# Patient Record
Sex: Male | Born: 1960 | Race: White | Hispanic: No | Marital: Married | Smoking: Never smoker
Health system: Southern US, Community
[De-identification: ages and names within clinical notes are randomized; demographics above are authoritative.]

## PROBLEM LIST (undated history)

## (undated) DIAGNOSIS — M109 Gout, unspecified: Secondary | ICD-10-CM

## (undated) DIAGNOSIS — R569 Unspecified convulsions: Secondary | ICD-10-CM

## (undated) DIAGNOSIS — M1990 Unspecified osteoarthritis, unspecified site: Secondary | ICD-10-CM

## (undated) DIAGNOSIS — E785 Hyperlipidemia, unspecified: Secondary | ICD-10-CM

## (undated) DIAGNOSIS — T7840XA Allergy, unspecified, initial encounter: Secondary | ICD-10-CM

## (undated) DIAGNOSIS — F32A Depression, unspecified: Secondary | ICD-10-CM

## (undated) DIAGNOSIS — I10 Essential (primary) hypertension: Secondary | ICD-10-CM

## (undated) HISTORY — DX: Essential (primary) hypertension: I10

## (undated) HISTORY — DX: Gout, unspecified: M10.9

## (undated) HISTORY — DX: Depression, unspecified: F32.A

## (undated) HISTORY — DX: Allergy, unspecified, initial encounter: T78.40XA

## (undated) HISTORY — DX: Hyperlipidemia, unspecified: E78.5

## (undated) HISTORY — DX: Unspecified convulsions: R56.9

## (undated) HISTORY — DX: Unspecified osteoarthritis, unspecified site: M19.90

---

## 2007-11-05 ENCOUNTER — Encounter: Payer: Self-pay | Admitting: Family Medicine

## 2007-11-05 LAB — CONVERTED CEMR LAB
AST: 15 units/L
BUN: 7 mg/dL
Chloride: 97 meq/L
Cholesterol: 168 mg/dL
Creatinine, Ser: 0.9 mg/dL
Glucose, Bld: 83 mg/dL
HCT: 39.6 %
Hemoglobin: 13.5 g/dL
PSA: 2.2 ng/mL
Platelets: 208 10*3/uL
Potassium: 4.3 meq/L

## 2008-08-09 ENCOUNTER — Emergency Department (HOSPITAL_COMMUNITY): Admission: EM | Admit: 2008-08-09 | Discharge: 2008-08-09 | Payer: Self-pay | Admitting: Emergency Medicine

## 2008-11-21 ENCOUNTER — Ambulatory Visit: Payer: Self-pay | Admitting: Family Medicine

## 2008-11-21 DIAGNOSIS — M5030 Other cervical disc degeneration, unspecified cervical region: Secondary | ICD-10-CM | POA: Insufficient documentation

## 2008-11-21 DIAGNOSIS — M5137 Other intervertebral disc degeneration, lumbosacral region: Secondary | ICD-10-CM | POA: Insufficient documentation

## 2008-11-22 ENCOUNTER — Encounter: Payer: Self-pay | Admitting: Family Medicine

## 2008-11-22 DIAGNOSIS — F1021 Alcohol dependence, in remission: Secondary | ICD-10-CM | POA: Insufficient documentation

## 2008-11-22 DIAGNOSIS — E785 Hyperlipidemia, unspecified: Secondary | ICD-10-CM | POA: Insufficient documentation

## 2008-11-22 DIAGNOSIS — I10 Essential (primary) hypertension: Secondary | ICD-10-CM | POA: Insufficient documentation

## 2008-11-22 DIAGNOSIS — M899 Disorder of bone, unspecified: Secondary | ICD-10-CM | POA: Insufficient documentation

## 2008-11-22 DIAGNOSIS — M109 Gout, unspecified: Secondary | ICD-10-CM

## 2008-11-22 DIAGNOSIS — M949 Disorder of cartilage, unspecified: Secondary | ICD-10-CM

## 2008-11-22 DIAGNOSIS — F3289 Other specified depressive episodes: Secondary | ICD-10-CM | POA: Insufficient documentation

## 2008-11-22 DIAGNOSIS — F329 Major depressive disorder, single episode, unspecified: Secondary | ICD-10-CM | POA: Insufficient documentation

## 2008-11-22 HISTORY — DX: Essential (primary) hypertension: I10

## 2008-11-22 HISTORY — DX: Gout, unspecified: M10.9

## 2008-11-22 HISTORY — DX: Hyperlipidemia, unspecified: E78.5

## 2008-11-29 ENCOUNTER — Ambulatory Visit: Payer: Self-pay | Admitting: Family Medicine

## 2009-01-31 ENCOUNTER — Ambulatory Visit: Payer: Self-pay | Admitting: Family Medicine

## 2009-01-31 DIAGNOSIS — K5289 Other specified noninfective gastroenteritis and colitis: Secondary | ICD-10-CM | POA: Insufficient documentation

## 2009-12-18 ENCOUNTER — Ambulatory Visit: Payer: Self-pay | Admitting: Family Medicine

## 2009-12-18 DIAGNOSIS — J00 Acute nasopharyngitis [common cold]: Secondary | ICD-10-CM | POA: Insufficient documentation

## 2009-12-20 ENCOUNTER — Encounter: Payer: Self-pay | Admitting: Family Medicine

## 2009-12-20 ENCOUNTER — Telehealth: Payer: Self-pay | Admitting: Family Medicine

## 2010-01-29 ENCOUNTER — Telehealth: Payer: Self-pay | Admitting: Family Medicine

## 2010-01-30 ENCOUNTER — Ambulatory Visit: Payer: Self-pay | Admitting: Family Medicine

## 2010-01-30 DIAGNOSIS — K047 Periapical abscess without sinus: Secondary | ICD-10-CM | POA: Insufficient documentation

## 2010-04-02 NOTE — Progress Notes (Signed)
Summary: abcess tooth  Phone Note Call from Patient Call back at Home Phone [phone]   Caller: Patient Summary of Call: Pt calls and states has an abcessed tooth and dentist will not see him until the infections is gone. Wanted to know if you would call in antibiotic for him or does he need to be seen. Uses [facility] in [place]. Pt doesn't get off work until 4:00pm. Pt is allergic to PCN Initial call taken by: Kathlene November LPN,  January 29, 2010 8:28 AM  Follow-up for Phone Call        I'd like to confirm this with his dentist.   Follow-up by: Seymour Bars DO,  January 29, 2010 8:43 AM  Additional Follow-up for Phone Call Additional follow up Details #1::        Called and [facility] for pt to return call Additional Follow-up by: Kathlene November LPN,  January 29, 2010 9:04 AM    Additional Follow-up for Phone Call Additional follow up Details #2::    [phone] and they said he has to meet certain criteria before ever seen by a dentist there and that criteria is must be 100% service connected and he is not so he could never be seen by a dentist with them due to this Follow-up by: Kathlene November LPN,  January 29, 2010 4:20 PM  Additional Follow-up for Phone Call Additional follow up Details #3:: Details for Additional Follow-up Action Taken: see if he can come in for OV tomorrow.  This will be a temporary fix as he needs to see a dentist for treatment. Additional Follow-up by: Seymour Bars DO,  January 29, 2010 4:39 PM

## 2010-04-02 NOTE — Assessment & Plan Note (Signed)
Summary: dental abscess   Vital Signs:  Patient profile:   50 year old male Height:      75 inches Weight:      223 pounds BMI:     27.97 O2 Sat:      97 % on Room air Temp:     98.3 degrees F oral Pulse rate:   80 / minute BP sitting:   130 / 78  (left arm) Cuff size:   large  Vitals Entered By: Payton Spark CMA (January 30, 2010 3:51 PM)  O2 Flow:  Room air CC: Abscessed tooth   Primary Care Provider:  Seymour Bars DO  CC:  Abscessed tooth.  History of Present Illness: 50 yo WM presents for a R upper dental abscess.  This started to hurt 3 days ago.  Hurts with chewing.  No fevers or chills.  Has some R cheek swelling.  [facility]  He does have insurance but he does not have a Radiation protection practitioner yet.    Allergies: No Known Drug Allergies  Past History:  Past Medical History: Reviewed history from 11/22/2008 and no changes required. Lumbar DDD Gout High cholesterol HTN depression with bipolar tendencies was on testosterone injections  Past Surgical History: Reviewed history from 11/29/2008 and no changes required. lumbar and cervial DDD HTN  sees the [place].    Family History: Reviewed history from 11/21/2008 and no changes required. mother died at 67, lung cancer father unknown sister SLE  Social History: Reviewed history from 11/21/2008 and no changes required. 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  2 grown kids in [place] and [place] [employer].  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 Walks 20 min 2 x a wk.  Review of Systems      See HPI  Physical Exam  General:  alert, well-developed, well-nourished, and well-hydrated.   Head:  normocephalic and atraumatic.  mild R maxilary swelling and tenderness Mouth:  poor dentition and teeth missing.  decay R upper molar Neck:  no masses.   Lungs:  Normal respiratory effort, chest expands symmetrically. Lungs are clear to auscultation, no  crackles or wheezes. Heart:  Normal rate and regular rhythm. S1 and S2 normal without gallop, murmur, click, rub or other extra sounds. Skin:  color normal.   Cervical Nodes:  No lymphadenopathy noted   Impression & Recommendations:  Problem # 1:  ABSCESS, TOOTH (ICD-522.5) Will treat with Augmentin, RX ibuprofen and heat packs. He was given a list of local dentists to see.  Complete Medication List: 1)  Colchicine 0.6 Mg Tabs (Colchicine) .... Take 1 tab by mouth two times a day as needed 2)  Pravastatin Sodium 80 Mg Tabs (Pravastatin sodium) .... Take 1 tab by mouth once daily 3)  Atenolol 25 Mg Tabs (Atenolol) .... Take 1 tab by mouth once daily 4)  Ibuprofen 800 Mg Tabs (Ibuprofen) .[name].. 1 tab by mouth three times a day as needed pain 5)  Allopurinol 100 Mg Tabs (Allopurinol) .... Take 1 tab by mouth once daily 6)  Norvasc 10 Mg Tabs (Amlodipine besylate) .[name].. 1 tab by mouth daily 7)  Augmentin 875-125 Mg Tabs (Amoxicillin-pot clavulanate) .[name].. 1 tab by mouth two times a day x 10 days; take with food 8)  Cyclobenzaprine Hcl 10 Mg Tabs (Cyclobenzaprine hcl) .[name].. 1 tab by mouth at bedtime as needed back spasm  Patient Instructions: 1)  For dental abscess:  2)  Take 10 days of Augmentin - 1 tab with breakfast and dinner each day. 3)  Use RX Ibuprofen 800 mg 3 x a dayd (with food). 4)  Use RX Cyclobenzaprine at night for back spasm. 5)  Use warm compress for pain.  Soft diet. 6)  F/U with dentist in the next 2 wks. Prescriptions: IBUPROFEN 800 MG TABS (IBUPROFEN) 1 tab by mouth three times a day as needed pain  #90 x 0   Entered and Authorized by:   Seymour Bars DO   Signed by:   Seymour Bars DO on 01/30/2010   Method used:   Electronically to        [facility] [phone]* (retail)       [address] [place], [place]  [address]       Ph: [phone]       Fax: [phone]   RxID:   [id number] CYCLOBENZAPRINE HCL 10 MG TABS (CYCLOBENZAPRINE HCL) 1 tab by mouth at bedtime  as needed back spasm  #20 x 0   Entered and Authorized by:   Seymour Bars DO   Signed by:   Seymour Bars DO on 01/30/2010   Method used:   Electronically to        [facility] [phone]* (retail)       [address] [place], [place]  [address]       Ph: [phone]       Fax: [phone]   RxID:   [id number] AUGMENTIN 875-125 MG TABS (AMOXICILLIN-POT CLAVULANATE) 1 tab by mouth two times a day x 10 days; take with food  #20 x 0   Entered and Authorized by:   Seymour Bars DO   Signed by:   Seymour Bars DO on 01/30/2010   Method used:   Electronically to        [facility] [phone]* (retail)       [address] [place], [place]  [address]       Ph: [phone]       Fax: [phone]   RxID:   [id number]    Orders Added: 1)  Est. Patient Level III [70[address]]

## 2010-04-02 NOTE — Progress Notes (Signed)
Summary: Wants work note extended- still sick  Phone Note Call from Patient Call back at [employer] [phone]   Caller: Patient Call For: Seymour Bars DO Summary of Call: Pt calls and states he is still sick- feeling a little better but not much and wants to know if you will extend his work note until Sunday. He will go back into work Sunday night. Fax work note to [phone] from 17th-23 are the days he needs. Initial call taken by: Kathlene November LPN,  December 20, 2009 8:11 AM  Follow-up for Phone Call        done Follow-up by: Seymour Bars DO,  December 20, 2009 10:14 AM

## 2010-04-02 NOTE — Letter (Signed)
Summary: Out of Work  [facility]  [address], [address]   [place], [place] [address]   Phone: [phone]  Fax: [phone]    December 20, 2009   Employee:  [name] Cieslak    To Whom It May Concern:   For Medical reasons, please excuse the above named employee from work for the following dates:  Start:   Oct 17th -23rd  End:   Oct 24th  If you need additional information, please feel free to contact our office.         Sincerely,    Seymour Bars DO

## 2010-04-02 NOTE — Assessment & Plan Note (Signed)
Summary: cold   Vital Signs:  Patient profile:   50 year old male Height:      75 inches Weight:      220 pounds BMI:     27.60 O2 Sat:      99 % on Room air Temp:     98.6 degrees F oral Pulse rate:   101 / minute BP sitting:   146 / 79  (left arm) Cuff size:   large  Vitals Entered By: Payton Spark CMA (December 18, 2009 10:19 AM)  O2 Flow:  Room air CC: ST, congestion, body aches x 2 days.   Primary Care Provider:  Seymour Bars DO  CC:  ST, congestion, and body aches x 2 days.[name]  History of Present Illness: 50 yo WM presents for a sore throat that started on Sunday night.  He has head congestion, cough and bodyaches.  He has a mostly dry cough with postnasal drip.  He is taking Theraflu.   Has a HA.    He does not have any chest tightness or SOB.  He missed work last night.     Allergies: No Known Drug Allergies  Past History:  Past Medical History: Reviewed history from 11/22/2008 and no changes required. Lumbar DDD Gout High cholesterol HTN depression with bipolar tendencies was on testosterone injections  Social History: Reviewed history from 11/21/2008 and no changes required. 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  2 grown kids in [place] and [place] [employer].  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 Walks 20 min 2 x a wk.  Review of Systems      See HPI  Physical Exam  General:  alert, well-developed, well-nourished, and well-hydrated.   Head:  normocephalic and atraumatic.  sinuses NTTP Eyes:  eyes glassy and watery, no injection Ears:  EACs patent; TMs translucent and gray with good cone of light and bony landmarks.  Nose:  copious nasal congestion with clear rhinorrhea Mouth:  o/p injected.  No exudates or vesicles Neck:  shotty submandibular nodes Lungs:  Normal respiratory effort, chest expands symmetrically. Lungs are clear to auscultation, no crackles or wheezes. Heart:  Normal rate and regular rhythm. S1 and S2 normal without  gallop, murmur, click, rub or other extra sounds. Skin:  color normal and no rashes.  skin warm and dry   Impression & Recommendations:  Problem # 1:  ACUTE NASOPHARYNGITIS (ICD-460) Rapid strep: neg. Day 3 of classic nasopharyngitis.  Will treat with supportive care measures.  No sign of secondary bacterial infection.  Out of work note given for yesterday and today.  Call if not improving after 10 days. His updated medication list for this problem includes:    Ibuprofen 800 Mg Tabs (Ibuprofen) .[name]... Take 1 tab by mouth two times a day as needed    Promethazine Vc/codeine 6.25-5-10 Mg/46ml Syrp (Phenyleph-promethazine-cod) .Marland KitchenMarland KitchenMarland KitchenMarland Kitchen 5 ml by mouth q 6 hrs as needed cough and congestion  Orders: Rapid Strep (04540)  Complete Medication List: 1)  Colchicine 0.6 Mg Tabs (Colchicine) .... Take 1 tab by mouth two times a day as needed 2)  Pravastatin Sodium 80 Mg Tabs (Pravastatin sodium) .... Take 1 tab by mouth once daily 3)  Atenolol 25 Mg Tabs (Atenolol) .... Take 1 tab by mouth once daily 4)  Ibuprofen 800 Mg Tabs (Ibuprofen) .... Take 1 tab by mouth two times a day as needed 5)  Allopurinol 100 Mg Tabs (Allopurinol) .... Take 1 tab by mouth once daily 6)  Methocarbamol 750 Mg Tabs (Methocarbamol) .... Take 1 tab by mouth two times a day 7)  Norvasc 10 Mg Tabs (Amlodipine besylate) .[name].. 1 tab by mouth daily 8)  Promethazine Vc/codeine 6.25-5-10 Mg/71ml Syrp (Phenyleph-promethazine-cod) .... 5 ml by mouth q 6 hrs as needed cough and congestion  Patient Instructions: 1)  Take Theraflu during the day and RX cough syrup at night. 2)  You can use it every 6 hrs if you are staying at home resting. 3)  Rest, clear fluids.  Expect improvements in the next 10 days. 4)  Call if any problems. Prescriptions: PROMETHAZINE VC/CODEINE 6.25-5-10 MG/5ML SYRP (PHENYLEPH-PROMETHAZINE-COD) 5 ml by mouth q 6 hrs as needed cough and congestion  #120 ml x 0   Entered and Authorized by:   Seymour Bars DO   Signed  by:   Seymour Bars DO on 12/18/2009   Method used:   Printed then faxed to ...       [address] [phone]* (retail)       [address] [place], [place]  [address]       Ph: [phone]       Fax: [phone]   RxID:   [id number]    Orders Added: 1)  Rapid Strep [24401] 2)  Est. Patient Level III [02725]    Laboratory Results    Other Tests  Rapid Strep: negative

## 2010-04-02 NOTE — Letter (Signed)
Summary: Out of Work  [facility]  [address], [address]   [place], [place] [address]   Phone: [phone]  Fax: [phone]    December 18, 2009   Employee:  [name] Bourn    To Whom It May Concern:   For Medical reasons, please excuse the above named employee from work for the following dates:  Start:   Oct 17th - Oct 18th  End:   Oct 19th  If you need additional information, please feel free to contact our office.         Sincerely,    Seymour Bars DO

## 2011-05-23 ENCOUNTER — Ambulatory Visit (INDEPENDENT_AMBULATORY_CARE_PROVIDER_SITE_OTHER): Payer: 59 | Admitting: Physician Assistant

## 2011-05-23 ENCOUNTER — Encounter: Payer: Self-pay | Admitting: Physician Assistant

## 2011-05-23 DIAGNOSIS — J111 Influenza due to unidentified influenza virus with other respiratory manifestations: Secondary | ICD-10-CM

## 2011-05-23 DIAGNOSIS — H6120 Impacted cerumen, unspecified ear: Secondary | ICD-10-CM

## 2011-05-23 DIAGNOSIS — I10 Essential (primary) hypertension: Secondary | ICD-10-CM

## 2011-05-23 MED ORDER — OSELTAMIVIR PHOSPHATE 75 MG PO CAPS: 75.0000 mg | ORAL_CAPSULE | Freq: Two times a day (BID) | ORAL | Status: AC

## 2011-05-23 NOTE — Progress Notes (Signed)
  Subjective:    Patient ID: Thomas Noble, male    DOB: 1960/09/11, 51 y.o.   MRN: [id number]  HPI Patient presents to clinic because he has not felt well since yesterday. He states that "it hit him like a ton of bricks" it started having chills and sweats on and off. His muscles aches all over and all he wants to do is sleep. He denies cough, sinus pressure, SOB, and wheezing. He has not had any nausea, vomiting, or diarrrhea. He has not taken his temperature but did have low grad temperature today. He does have some sore throat but no ear pain.     Review of Systems     Objective:   Physical Exam  HENT:  Head: Normocephalic and atraumatic.  Right Ear: External ear normal.  Left Ear: External ear normal.  Nose: Nose normal.  Mouth/Throat: Oropharynx is clear and moist. No oropharyngeal exudate.       Right ear was filled with cerumen and obstructed vision of TM. After lavage bilateral TM's normal. Negative for maxillary tenderness.   Eyes: Right eye exhibits discharge. Left eye exhibits discharge.       Bilateral conjunctivia injected with clear watery discharge.   Neck: Normal range of motion. Neck supple.  Cardiovascular: Normal rate, regular rhythm and normal heart sounds.   Pulmonary/Chest: Effort normal and breath sounds normal. He has no wheezes.  Abdominal: Soft. Bowel sounds are normal. He exhibits no distension and no mass. There is no tenderness. There is no rebound.  Musculoskeletal:       When palpated legs and arms patient complains of being tender to touch.   Lymphadenopathy:    He has no cervical adenopathy.          Assessment & Plan:  HTN- BP is high today most likey to due to body fighting off infection. Instructed patient to check bp after feeling better to make sure it goes back down.   Influenza- Clinical dx without test. Fever, muscle aches and pains, feeling of fatigue, no upper respiratiory symptoms. Gave tamiflu and not for work for today. Gave handout and  enouraged to stay hydrated and rest.

## 2011-05-23 NOTE — Patient Instructions (Signed)
Start Tamiflu twice a day for 5 days. Stay hydrated and rest. Out of work today should be able to go back on Monday.   Influenza, Adult Influenza ("the flu") is a viral infection of the respiratory tract. It causes chills, fever, cough, headache, body aches, and sore throat. Influenza in general will make you feel sicker than when you have a common cold. Symptoms of the illness typically last a few days. Cough and fatigue may continue for as long as 7 to 10 days. Influenza is highly contagious. It spreads easily to others in the droplets from coughs and sneezes. People frequently become infected by touching something that was recently contaminated with the virus and then touch their mouth, nose or eyes. This infection is caused by a virus. Symptoms will not be reduced or improved by taking an antibiotic. Antibiotics are medications that kill bacteria, not viruses. DIAGNOSIS  Diagnosis of influenza is often made based on the history and physical examination as well as the presence of influenza reports occurring in your community. Testing can be done if the diagnosis is not certain. TREATMENT  Since influenza is caused by a virus, antibiotics are not helpful. Your caregiver may prescribe antiviral medicines to shorten the illness and lessen the severity. Your caregiver may also recommend influenza vaccination and/or antiviral medicines for your family members in order to prevent the spread of influenza to them. HOME CARE INSTRUCTIONS  DO NOT GIVE ASPIRIN TO PERSONS WITH INFLUENZA WHO ARE UNDER 39 YEARS OF AGE. This could lead to brain and liver damage (Reye's syndrome). Read the label on over-the-counter medicines.   Stay home from work or school if at all possible until most of your symptoms are gone.   Only take over-the-counter or prescription medicines for pain, discomfort, or fever as directed by your caregiver.   Use a cool mist humidifier to increase air moisture. This will make breathing  easier.   Rest until your temperature is nearly normal: 98.6 F (37 C). This usually takes 3 to 4 days. Be sure you get plenty of rest.   Drink at least eight, eight-ounce glasses of fluids per day. Fluids include water, juice, broth, gelatin, or lemonade.   Cover your mouth and nose when coughing or sneezing and wash your hands often to prevent the spread of this virus to other persons.  PREVENTION  Annual influenza vaccination (flu shots) is the best way to avoid getting influenza. An annual flu shot is now routinely recommended for all adults in the [place] SEEK MEDICAL CARE IF:   You develop shortness of breath while resting.   You have a deep cough with production of mucous or chest pain.   You develop nausea (feeling sick to your stomach), vomiting, or diarrhea.  SEEK IMMEDIATE MEDICAL CARE IF:   You have difficulty breathing, become short of breath, or your skin or nails turn bluish.   You develop severe neck pain or stiffness.   You develop a severe headache, facial pain, or earache.   You have a fever.   You develop nausea or vomiting that cannot be controlled.  Document Released: 02/15/2000 Document Revised: 02/06/2011 Document Reviewed: 12/20/2008 [facility] Patient Information 2012 [place], [place].

## 2011-05-26 ENCOUNTER — Encounter: Payer: Self-pay | Admitting: *Deleted

## 2011-05-26 ENCOUNTER — Telehealth: Payer: Self-pay | Admitting: *Deleted

## 2011-05-26 NOTE — Telephone Encounter (Signed)
LM for pt with instructions and faxed work note per pt request.

## 2011-05-26 NOTE — Telephone Encounter (Signed)
Pt states he still has stuffiness and congestion and doesn't feel well enough to go to work today. Would like to know if we can fax him a work note for today to his home fax. [phone]. Please advise.

## 2011-05-26 NOTE — Telephone Encounter (Signed)
Can fax note for one more day. Take mucinex twice and day and could help with congestion but will need to go to work tomorrow.

## 2013-04-11 ENCOUNTER — Encounter: Payer: Self-pay | Admitting: Physician Assistant

## 2013-04-11 ENCOUNTER — Ambulatory Visit (INDEPENDENT_AMBULATORY_CARE_PROVIDER_SITE_OTHER): Payer: PRIVATE HEALTH INSURANCE | Admitting: Physician Assistant

## 2013-04-11 VITALS — BP 153/86 | HR 87 | Temp 97.9°F | Ht 75.0 in | Wt 241.0 lb

## 2013-04-11 DIAGNOSIS — J019 Acute sinusitis, unspecified: Secondary | ICD-10-CM

## 2013-04-11 DIAGNOSIS — I10 Essential (primary) hypertension: Secondary | ICD-10-CM

## 2013-04-11 MED ORDER — AMOXICILLIN 500 MG PO CAPS: 500.0000 mg | ORAL_CAPSULE | Freq: Two times a day (BID) | ORAL | Status: DC

## 2013-04-11 NOTE — Patient Instructions (Addendum)
Mucinex(guafensine)  without D. Vitamin C and Zinc. If have some nasal congestion can try nasocort OTC  If not improving start abx.   Sinusitis Sinusitis is redness, soreness, and swelling (inflammation) of the paranasal sinuses. Paranasal sinuses are air pockets within the bones of your face (beneath the eyes, the middle of the forehead, or above the eyes). In healthy paranasal sinuses, mucus is able to drain out, and air is able to circulate through them by way of your nose. However, when your paranasal sinuses are inflamed, mucus and air can become trapped. This can allow bacteria and other germs to grow and cause infection. Sinusitis can develop quickly and last only a short time (acute) or continue over a long period (chronic). Sinusitis that lasts for more than 12 weeks is considered chronic.  CAUSES  Causes of sinusitis include:  Allergies.  Structural abnormalities, such as displacement of the cartilage that separates your nostrils (deviated septum), which can decrease the air flow through your nose and sinuses and affect sinus drainage.  Functional abnormalities, such as when the small hairs (cilia) that line your sinuses and help remove mucus do not work properly or are not present. SYMPTOMS  Symptoms of acute and chronic sinusitis are the same. The primary symptoms are pain and pressure around the affected sinuses. Other symptoms include:  Upper toothache.  Earache.  Headache.  Bad breath.  Decreased sense of smell and taste.  A cough, which worsens when you are lying flat.  Fatigue.  Fever.  Thick drainage from your nose, which often is green and may contain pus (purulent).  Swelling and warmth over the affected sinuses. DIAGNOSIS  Your caregiver will perform a physical exam. During the exam, your caregiver may:  Look in your nose for signs of abnormal growths in your nostrils (nasal polyps).  Tap over the affected sinus to check for signs of infection.  View the  inside of your sinuses (endoscopy) with a special imaging device with a light attached (endoscope), which is inserted into your sinuses. If your caregiver suspects that you have chronic sinusitis, one or more of the following tests may be recommended:  Allergy tests.  Nasal culture A sample of mucus is taken from your nose and sent to a lab and screened for bacteria.  Nasal cytology A sample of mucus is taken from your nose and examined by your caregiver to determine if your sinusitis is related to an allergy. TREATMENT  Most cases of acute sinusitis are related to a viral infection and will resolve on their own within 10 days. Sometimes medicines are prescribed to help relieve symptoms (pain medicine, decongestants, nasal steroid sprays, or saline sprays).  However, for sinusitis related to a bacterial infection, your caregiver will prescribe antibiotic medicines. These are medicines that will help kill the bacteria causing the infection.  Rarely, sinusitis is caused by a fungal infection. In theses cases, your caregiver will prescribe antifungal medicine. For some cases of chronic sinusitis, surgery is needed. Generally, these are cases in which sinusitis recurs more than 3 times per year, despite other treatments. HOME CARE INSTRUCTIONS   Drink plenty of water. Water helps thin the mucus so your sinuses can drain more easily.  Use a humidifier.  Inhale steam 3 to 4 times a day (for example, sit in the bathroom with the shower running).  Apply a warm, moist washcloth to your face 3 to 4 times a day, or as directed by your caregiver.  Use saline nasal sprays to help  moisten and clean your sinuses.  Take over-the-counter or prescription medicines for pain, discomfort, or fever only as directed by your caregiver. SEEK IMMEDIATE MEDICAL CARE IF:  You have increasing pain or severe headaches.  You have nausea, vomiting, or drowsiness.  You have swelling around your face.  You have  vision problems.  You have a stiff neck.  You have difficulty breathing. MAKE SURE YOU:   Understand these instructions.  Will watch your condition.  Will get help right away if you are not doing well or get worse. Document Released: 02/17/2005 Document Revised: 05/12/2011 Document Reviewed: 03/04/2011 [facility] Patient Information 2014 [place], [place].

## 2013-04-12 NOTE — Progress Notes (Signed)
   Subjective:    Patient ID: Thomas Noble, male    DOB: [birth year], 53 y.o.   MRN: [id number]  HPI Pt is a 53 yo male who presents to the clinic with sinus pressure and pain for the last 3 days that have worsened. Symptoms of URI infection started about 1 week ago. Pt does not come frequently to doctor unless feels bad. Pt complains of nasal congestion, ear pain, ST. Denies any fever, chills, rash, nausea, vomiting, mental status changes. Not tried anything because of HTN and didn't want to elevate BP. [facility]. No body aches. BP elevated today. Denies any CP, palpitations, SOB, HA's.     Review of Systems     Objective:   Physical Exam  Constitutional: He is oriented to person, place, and time. He appears well-developed and well-nourished.  HENT:  Head: Normocephalic and atraumatic.  Right Ear: External ear normal.  Left Ear: External ear normal.  Nose: Nose normal.  Mouth/Throat: Oropharynx is clear and moist. No oropharyngeal exudate.  TM's clear.   Maxillary sinuses tender to palpation bilaterally.   Eyes:  Bilateral conjunctiva injected.   Neck: Normal range of motion. Neck supple.  Cardiovascular: Normal rate, regular rhythm and normal heart sounds.   Pulmonary/Chest: Effort normal and breath sounds normal. He has no wheezes.  Lymphadenopathy:    He has no cervical adenopathy.  Neurological: He is alert and oriented to person, place, and time.  Skin: Skin is dry.  Psychiatric: He has a normal mood and affect. His behavior is normal.          Assessment & Plan:  Sinusitis- discussed trying regular mucinex OTC with vitamin C and zinc if not improving gave rx to be Treated with amoxil for 10 days. I do think there is an allergic component to exacerbation and why eyes were so injected. Recommended zyrtec daily for next week or so. HO given. Call if not improving or worsening. If nasal congestion continues try nasocort OTC.   HTN[facility].

## 2013-04-22 ENCOUNTER — Telehealth: Payer: Self-pay | Admitting: *Deleted

## 2013-04-22 NOTE — Telephone Encounter (Signed)
Pt left message stating that he was out of work yesterday but is feeling a lot better today.  He wants to know if you would write him a note for work.

## 2013-08-12 ENCOUNTER — Ambulatory Visit (INDEPENDENT_AMBULATORY_CARE_PROVIDER_SITE_OTHER): Payer: PRIVATE HEALTH INSURANCE | Admitting: Physician Assistant

## 2013-08-12 ENCOUNTER — Encounter: Payer: Self-pay | Admitting: Physician Assistant

## 2013-08-12 VITALS — BP 135/74 | HR 73 | Ht 75.0 in | Wt 242.0 lb

## 2013-08-12 DIAGNOSIS — Z79899 Other long term (current) drug therapy: Secondary | ICD-10-CM

## 2013-08-12 DIAGNOSIS — Z0000 Encounter for general adult medical examination without abnormal findings: Secondary | ICD-10-CM

## 2013-08-12 DIAGNOSIS — E785 Hyperlipidemia, unspecified: Secondary | ICD-10-CM

## 2013-08-12 NOTE — Progress Notes (Signed)
   Subjective:    Patient ID: Thomas Noble, male    DOB: 21-Feb-1961, 53 y.o.   MRN: [id number]  HPI Patient is a 53 year old male who presents to the clinic for complete physical. He would just like to be checked out. He had a [facility] physical [facility] this [facility] at [facility]. he would just like me to check out and order some more labs. He is having no problems or concerns today.   Review of Systems  All other systems reviewed and are negative.      Objective:   Physical Exam BP 135/74  Pulse 73  Ht 6\' 3"  (1.905 m)  Wt 242 lb (109.77 kg)  BMI 30.25 kg/m2  General Appearance:    Alert, cooperative, no distress, appears stated age  Head:    Normocephalic, without obvious abnormality, atraumatic  Eyes:    PERRL, conjunctiva/corneas clear, EOM's intact, fundi    benign, both eyes       Ears:    Normal TM's and external ear canals, both ears  Nose:   Nares normal, septum midline, mucosa normal, no drainage    or sinus tenderness  Throat:   Lips, mucosa, and tongue normal; teeth and gums normal  Neck:   Supple, symmetrical, trachea midline, no adenopathy;       thyroid:  No enlargement/tenderness/nodules; no carotid   bruit or JVD  Back:     Symmetric, no curvature, ROM normal, no CVA tenderness  Lungs:     Clear to auscultation bilaterally, respirations unlabored  Chest wall:    No tenderness or deformity  Heart:    Regular rate and rhythm, S1 and S2 normal, no murmur, rub   or gallop  Abdomen:     Soft, non-tender, bowel sounds active all four quadrants,    no masses, no organomegaly  Genitalia:    Not done.  Rectal:  Not done.  Extremities:   Extremities normal, atraumatic, no cyanosis or edema  Pulses:   2+ and symmetric all extremities  Skin:   Skin color, texture, turgor normal, no rashes or lesions  Lymph nodes:   Cervical, supraclavicular, and axillary nodes normal  Neurologic:   CNII-XII intact. Normal strength, sensation and reflexes      throughout         Assessment &  Plan:  CPE- will get fasting labs today. tdap UTD. All of her vaccines up to date. A UA was 1. [facility].  Depression 0/2. Negative. Patient needs colonoscopy however declines. Will not do colonoscopy. [facility]. Over 50 start asa daily. Encouraged regular exercise and to work on weight loss. All meds refilled by [facility].

## 2013-08-12 NOTE — Patient Instructions (Signed)

## 2013-08-13 LAB — COMPLETE METABOLIC PANEL WITH GFR
ALBUMIN: 5.1 g/dL (ref 3.5–5.2)
ALT: 20 U/L (ref 0–53)
AST: 16 U/L (ref 0–37)
Alkaline Phosphatase: 94 U/L (ref 39–117)
BILIRUBIN TOTAL: 0.4 mg/dL (ref 0.2–1.2)
BUN: 10 mg/dL (ref 6–23)
CALCIUM: 9.5 mg/dL (ref 8.4–10.5)
CHLORIDE: 98 meq/L (ref 96–112)
CO2: 24 meq/L (ref 19–32)
Creat: 0.77 mg/dL (ref 0.50–1.35)
GLUCOSE: 107 mg/dL — AB (ref 70–99)
POTASSIUM: 4.1 meq/L (ref 3.5–5.3)
SODIUM: 137 meq/L (ref 135–145)
TOTAL PROTEIN: 7.6 g/dL (ref 6.0–8.3)

## 2013-08-13 LAB — LIPID PANEL
Cholesterol: 213 mg/dL — ABNORMAL HIGH (ref 0–200)
HDL: 51 mg/dL (ref >39)
LDL CALC: 134 mg/dL — AB (ref 0–99)
Total CHOL/HDL Ratio: 4.2 Ratio
Triglycerides: 139 mg/dL (ref <150)
VLDL: 28 mg/dL (ref 0–40)

## 2014-01-09 ENCOUNTER — Encounter: Payer: Self-pay | Admitting: Family Medicine

## 2014-01-09 ENCOUNTER — Ambulatory Visit (INDEPENDENT_AMBULATORY_CARE_PROVIDER_SITE_OTHER): Payer: PRIVATE HEALTH INSURANCE | Admitting: Family Medicine

## 2014-01-09 VITALS — BP 155/91 | HR 97 | Temp 97.8°F | Wt 240.0 lb

## 2014-01-09 DIAGNOSIS — B9689 Other specified bacterial agents as the cause of diseases classified elsewhere: Secondary | ICD-10-CM

## 2014-01-09 DIAGNOSIS — A499 Bacterial infection, unspecified: Secondary | ICD-10-CM

## 2014-01-09 DIAGNOSIS — J329 Chronic sinusitis, unspecified: Secondary | ICD-10-CM

## 2014-01-09 MED ORDER — AMOXICILLIN-POT CLAVULANATE 500-125 MG PO TABS: ORAL_TABLET | ORAL | Status: AC

## 2014-01-09 NOTE — Progress Notes (Signed)
CC: Thomas Noble is a 53 y.o. male is here for Sinusitis   Subjective: HPI:   Bilateral facial pain localized in the cheeks which has been present for a week worsening on a daily basis. Worse with leaning forward.accompanied by nasal congestion and postnasal drip. Reports fatigue but denies fevers or chills. Denies cough, shortness of breath, chest pain, motor or sensory disturbances  Review Of Systems Outlined In HPI  No past medical history on file.  No past surgical history on file. No family history on file.  History   Social History  . Marital Status: Married    Spouse Name: N/A    Number of Children: N/A  . Years of Education: N/A   Occupational History  . Not on file.   Social History Main Topics  . Smoking status: Never Smoker   . Smokeless tobacco: Not on file  . Alcohol Use: Not on file  . Drug Use: Not on file  . Sexual Activity: Not on file   Other Topics Concern  . Not on file   Social History Narrative     Objective: BP 155/91 mmHg  Pulse 97  Temp(Src) 97.8 F (36.6 C) (Oral)  Wt 240 lb (108.863 kg)  General: Alert and Oriented, No Acute Distress appears moderately fatigued HEENT: Pupils equal, round, reactive to light. Conjunctivae clear.  External ears unremarkable, canals clear with intact TMs with appropriate landmarks.  Middle ear appears open without effusion. Pink inferior turbinates.  Moist mucous membranes, pharynx without inflammation nor lesionshowever moderate cobblestoning and postnasal drip.  Neck supple without palpable lymphadenopathy nor abnormal masses. Lungs: Clear to auscultation bilaterally, no wheezing/ronchi/rales.  Comfortable work of breathing. Good air movement. Extremities: No peripheral edema.  Strong peripheral pulses.  Mental Status: No depression, anxiety, nor agitation. Skin: Warm and dry.  Assessment & Plan: Thomas Noble was seen today for sinusitis.  Diagnoses and associated orders for this visit:  Bacterial  sinusitis - amoxicillin-clavulanate (AUGMENTIN) 500-125 MG per tablet; Take one by mouth every 8 hours for ten total days.    Bacterial sinusitis: Start Augmentin consider nasal saline washes and Alka-Seltzer cold and sinus.call if no better by Wednesday  Return if symptoms worsen or fail to improve.

## 2014-01-10 ENCOUNTER — Ambulatory Visit: Payer: PRIVATE HEALTH INSURANCE | Admitting: Family Medicine

## 2014-04-19 ENCOUNTER — Ambulatory Visit (INDEPENDENT_AMBULATORY_CARE_PROVIDER_SITE_OTHER): Payer: PRIVATE HEALTH INSURANCE | Admitting: Physician Assistant

## 2014-04-19 ENCOUNTER — Encounter: Payer: Self-pay | Admitting: Physician Assistant

## 2014-04-19 VITALS — BP 136/82 | HR 78 | Temp 98.4°F | Ht 75.0 in | Wt 239.0 lb

## 2014-04-19 DIAGNOSIS — J329 Chronic sinusitis, unspecified: Secondary | ICD-10-CM

## 2014-04-19 DIAGNOSIS — B9689 Other specified bacterial agents as the cause of diseases classified elsewhere: Secondary | ICD-10-CM

## 2014-04-19 DIAGNOSIS — A499 Bacterial infection, unspecified: Secondary | ICD-10-CM

## 2014-04-19 MED ORDER — AMOXICILLIN-POT CLAVULANATE 875-125 MG PO TABS: 1.0000 | ORAL_TABLET | Freq: Two times a day (BID) | ORAL | Status: DC

## 2014-04-19 NOTE — Patient Instructions (Signed)
flonase 2 sprays each nostril.  

## 2014-04-20 NOTE — Progress Notes (Signed)
   Subjective:    Patient ID: Thomas Noble, male    DOB: 1960/03/25, 54 y.o.   MRN: [id number]  HPI  Pt presents to the clinic with nasal congestion, ear pain, dry cough and other URI symptoms for a little over a week. He woke up this morning with severe pressure and headache. No fever or chills. No cough, wheezing or SOB. He did try some mucinex with little relief.   Review of Systems  All other systems reviewed and are negative.      Objective:   Physical Exam  Constitutional: He is oriented to person, place, and time. He appears well-developed and well-nourished.  HENT:  Head: Normocephalic and atraumatic.  Right Ear: External ear normal.  Left Ear: External ear normal.  Mouth/Throat: Oropharynx is clear and moist. No oropharyngeal exudate.  Tenderness over frontal sinuses to palpation.   Nasal turbinates red and swollen.   Eyes:  Slightly injected conjunctivia bilaterally.   Neck: Normal range of motion. Neck supple.  Cardiovascular: Normal rate, regular rhythm and normal heart sounds.   Pulmonary/Chest: Effort normal and breath sounds normal. He has no wheezes.  Neurological: He is alert and oriented to person, place, and time.  Skin: Skin is dry.  Psychiatric: He has a normal mood and affect. His behavior is normal.          Assessment & Plan:  Bacterial sinusitis- treated with augmentin for 10 days. HO given. Discussed flonase OTC and nasal saline.

## 2014-06-13 ENCOUNTER — Ambulatory Visit (INDEPENDENT_AMBULATORY_CARE_PROVIDER_SITE_OTHER): Payer: PRIVATE HEALTH INSURANCE | Admitting: Physician Assistant

## 2014-06-13 ENCOUNTER — Encounter: Payer: Self-pay | Admitting: Physician Assistant

## 2014-06-13 VITALS — BP 139/85 | HR 76 | Wt 244.0 lb

## 2014-06-13 DIAGNOSIS — G8929 Other chronic pain: Secondary | ICD-10-CM | POA: Diagnosis not present

## 2014-06-13 DIAGNOSIS — M5136 Other intervertebral disc degeneration, lumbar region: Secondary | ICD-10-CM

## 2014-06-13 DIAGNOSIS — M545 Low back pain, unspecified: Secondary | ICD-10-CM

## 2014-06-13 DIAGNOSIS — M62830 Muscle spasm of back: Secondary | ICD-10-CM

## 2014-06-13 MED ORDER — MELOXICAM 15 MG PO TABS: 15.0000 mg | ORAL_TABLET | Freq: Every day | ORAL | Status: DC

## 2014-06-13 MED ORDER — TRAMADOL HCL 50 MG PO TABS: 50.0000 mg | ORAL_TABLET | Freq: Three times a day (TID) | ORAL | Status: DC | PRN

## 2014-06-13 MED ORDER — DICLOFENAC SODIUM 2 % TD SOLN: TRANSDERMAL | Status: DC

## 2014-06-13 MED ORDER — KETOROLAC TROMETHAMINE 60 MG/2ML IM SOLN
60.0000 mg | Freq: Once | INTRAMUSCULAR | Status: AC
  Administered 2014-06-13: 60 mg via INTRAMUSCULAR

## 2014-06-13 MED ORDER — CYCLOBENZAPRINE HCL 10 MG PO TABS: 10.0000 mg | ORAL_TABLET | Freq: Three times a day (TID) | ORAL | Status: DC | PRN

## 2014-06-13 NOTE — Progress Notes (Signed)
   Subjective:    Patient ID: Thomas Noble, male    DOB: 1960-03-10, 54 y.o.   MRN: [id number]  HPI  Pt presents to the clinic with 5 days of bilateral lower back pain and catching. About 2 weeks ago he was in a workmans comp injury where he fell and hit a metal post in right rib area and bruised right side front to back. He was on light duty for almost 2 weeks. He just went back to full duty. On Friday he was doing a lot of lifting and twisting when pain in lower back started. Describes as a "tightness and muscle pulling away from bone". Pain is not constant but rather "catches when he makes the wrong movement". Yesterday he admits to feeling better with ibuprofen and rest. Certainly twisting makes worse. Pain is low and on both sides. No radiation into legs. No saddle anthesthia. Hx of chronic back pain, DDD but this pain feels different.     Review of Systems  All other systems reviewed and are negative.      Objective:   Physical Exam  Constitutional: He is oriented to person, place, and time. He appears well-developed and well-nourished.  HENT:  Head: Normocephalic and atraumatic.  Cardiovascular: Normal rate, regular rhythm and normal heart sounds.   Pulmonary/Chest: Effort normal and breath sounds normal.  Musculoskeletal:  ROM at waist normal with some mild pulling and discomfort.  Lateral side to side movements are painful and tight. No pain to palpation over lumbar spine to palpation.  Negative straight leg test, bilaterally.  Tightness over lumbar paraspinous muscles.   Neurological: He is alert and oriented to person, place, and time.  Psychiatric: He has a normal mood and affect. His behavior is normal.          Assessment & Plan:  Muscle spasms of lower back/chronic low back pain- symptoms seem like pt is experiencing muscle spasms from a weak back. Perhaps light duty to full duty was too fast of transition. toradol 60mg  IM given in office today. mobic for next 2  weeks given then as needed. Discussed GI Side effects. pennsaid samples also given bid over affected area. Do not take mobic with ibuprofen. Flexeril for muscle relaxation up to 3 times a day. Sedation warning given. Formal PT ordered. Written for light duty work until [employer] finished. Follow up after PT finished or significantly improving. I did give some tramadol for as needed pain not controlled with above measures.

## 2014-06-13 NOTE — Patient Instructions (Signed)
Low Back Sprain with Rehab  A sprain is an injury in which a ligament is torn. The ligaments of the lower back are vulnerable to sprains. However, they are strong and require great force to be injured. These ligaments are important for stabilizing the spinal column. Sprains are classified into three categories. Grade 1 sprains cause pain, but the tendon is not lengthened. Grade 2 sprains include a lengthened ligament, due to the ligament being stretched or partially ruptured. With grade 2 sprains there is still function, although the function may be decreased. Grade 3 sprains involve a complete tear of the tendon or muscle, and function is usually impaired. SYMPTOMS   Severe pain in the lower back.  Sometimes, a feeling of a "pop," "snap," or tear, at the time of injury.  Tenderness and sometimes swelling at the injury site.  Uncommonly, bruising (contusion) within 48 hours of injury.  Muscle spasms in the back. CAUSES  Low back sprains occur when a force is placed on the ligaments that is greater than they can handle. Common causes of injury include:  Performing a stressful act while off-balance.  Repetitive stressful activities that involve movement of the lower back.  Direct hit (trauma) to the lower back. RISK INCREASES WITH:  Contact sports (football, wrestling).  Collisions (major skiing accidents).  Sports that require throwing or lifting (baseball, weightlifting).  Sports involving twisting of the spine (gymnastics, diving, tennis, golf).  Poor strength and flexibility.  Inadequate protection.  Previous back injury or surgery (especially fusion). PREVENTION  Wear properly fitted and padded protective equipment.  Warm up and stretch properly before activity.  Allow for adequate recovery between workouts.  Maintain physical fitness:  Strength, flexibility, and endurance.  Cardiovascular fitness.  Maintain a healthy body weight. PROGNOSIS  If treated  properly, low back sprains usually heal with non-surgical treatment. The length of time for healing depends on the severity of the injury.  RELATED COMPLICATIONS   Recurring symptoms, resulting in a chronic problem.  Chronic inflammation and pain in the low back.  Delayed healing or resolution of symptoms, especially if activity is resumed too soon.  Prolonged impairment.  Unstable or arthritic joints of the low back. TREATMENT  Treatment first involves the use of ice and medicine, to reduce pain and inflammation. The use of strengthening and stretching exercises may help reduce pain with activity. These exercises may be performed at home or with a therapist. Severe injuries may require referral to a therapist for further evaluation and treatment, such as ultrasound. Your caregiver may advise that you wear a back brace or corset, to help reduce pain and discomfort. Often, prolonged bed rest results in greater harm then benefit. Corticosteroid injections may be recommended. However, these should be reserved for the most serious cases. It is important to avoid using your back when lifting objects. At night, sleep on your back on a firm mattress, with a pillow placed under your knees. If non-surgical treatment is unsuccessful, surgery may be needed.  MEDICATION   If pain medicine is needed, nonsteroidal anti-inflammatory medicines (aspirin and ibuprofen), or other minor pain relievers (acetaminophen), are often advised.  Do not take pain medicine for 7 days before surgery.  Prescription pain relievers may be given, if your caregiver thinks they are needed. Use only as directed and only as much as you need.  Ointments applied to the skin may be helpful.  Corticosteroid injections may be given by your caregiver. These injections should be reserved for the most serious cases,   because they may only be given a certain number of times. HEAT AND COLD  Cold treatment (icing) should be applied for 10  to 15 minutes every 2 to 3 hours for inflammation and pain, and immediately after activity that aggravates your symptoms. Use ice packs or an ice massage.  Heat treatment may be used before performing stretching and strengthening activities prescribed by your caregiver, physical therapist, or athletic trainer. Use a heat pack or a warm water soak. SEEK MEDICAL CARE IF:   Symptoms get worse or do not improve in 2 to 4 weeks, despite treatment.  You develop numbness or weakness in either leg.  You lose bowel or bladder function.  Any of the following occur after surgery: fever, increased pain, swelling, redness, drainage of fluids, or bleeding in the affected area.  New, unexplained symptoms develop. (Drugs used in treatment may produce side effects.) EXERCISES  RANGE OF MOTION (ROM) AND STRETCHING EXERCISES - Low Back Sprain Most people with lower back pain will find that their symptoms get worse with excessive bending forward (flexion) or arching at the lower back (extension). The exercises that will help resolve your symptoms will focus on the opposite motion.  Your physician, physical therapist or athletic trainer will help you determine which exercises will be most helpful to resolve your lower back pain. Do not complete any exercises without first consulting with your caregiver. Discontinue any exercises which make your symptoms worse, until you speak to your caregiver. If you have pain, numbness or tingling which travels down into your buttocks, leg or foot, the goal of the therapy is for these symptoms to move closer to your back and eventually resolve. Sometimes, these leg symptoms will get better, but your lower back pain may worsen. This is often an indication of progress in your rehabilitation. Be very alert to any changes in your symptoms and the activities in which you participated in the 24 hours prior to the change. Sharing this information with your caregiver will allow him or her to  most efficiently treat your condition. These exercises may help you when beginning to rehabilitate your injury. Your symptoms may resolve with or without further involvement from your physician, physical therapist or athletic trainer. While completing these exercises, remember:   Restoring tissue flexibility helps normal motion to return to the joints. This allows healthier, less painful movement and activity.  An effective stretch should be held for at least 30 seconds.  A stretch should never be painful. You should only feel a gentle lengthening or release in the stretched tissue. FLEXION RANGE OF MOTION AND STRETCHING EXERCISES: STRETCH - Flexion, Single Knee to Chest   Lie on a firm bed or floor with both legs extended in front of you.  Keeping one leg in contact with the floor, bring your opposite knee to your chest. Hold your leg in place by either grabbing behind your thigh or at your knee.  Pull until you feel a gentle stretch in your low back. Hold __________ seconds.  Slowly release your grasp and repeat the exercise with the opposite side. Repeat __________ times. Complete this exercise __________ times per day.  STRETCH - Flexion, Double Knee to Chest  Lie on a firm bed or floor with both legs extended in front of you.  Keeping one leg in contact with the floor, bring your opposite knee to your chest.  Tense your stomach muscles to support your back and then lift your other knee to your chest. Hold your legs   in place by either grabbing behind your thighs or at your knees.  Pull both knees toward your chest until you feel a gentle stretch in your low back. Hold __________ seconds.  Tense your stomach muscles and slowly return one leg at a time to the floor. Repeat __________ times. Complete this exercise __________ times per day.  STRETCH - Low Trunk Rotation  Lie on a firm bed or floor. Keeping your legs in front of you, bend your knees so they are both pointed toward the  ceiling and your feet are flat on the floor.  Extend your arms out to the side. This will stabilize your upper body by keeping your shoulders in contact with the floor.  Gently and slowly drop both knees together to one side until you feel a gentle stretch in your low back. Hold for __________ seconds.  Tense your stomach muscles to support your lower back as you bring your knees back to the starting position. Repeat the exercise to the other side. Repeat __________ times. Complete this exercise __________ times per day  EXTENSION RANGE OF MOTION AND FLEXIBILITY EXERCISES: STRETCH - Extension, Prone on Elbows   Lie on your stomach on the floor, a bed will be too soft. Place your palms about shoulder width apart and at the height of your head.  Place your elbows under your shoulders. If this is too painful, stack pillows under your chest.  Allow your body to relax so that your hips drop lower and make contact more completely with the floor.  Hold this position for __________ seconds.  Slowly return to lying flat on the floor. Repeat __________ times. Complete this exercise __________ times per day.  RANGE OF MOTION - Extension, Prone Press Ups  Lie on your stomach on the floor, a bed will be too soft. Place your palms about shoulder width apart and at the height of your head.  Keeping your back as relaxed as possible, slowly straighten your elbows while keeping your hips on the floor. You may adjust the placement of your hands to maximize your comfort. As you gain motion, your hands will come more underneath your shoulders.  Hold this position __________ seconds.  Slowly return to lying flat on the floor. Repeat __________ times. Complete this exercise __________ times per day.  RANGE OF MOTION- Quadruped, Neutral Spine   Assume a hands and knees position on a firm surface. Keep your hands under your shoulders and your knees under your hips. You may place padding under your knees for  comfort.  Drop your head and point your tailbone toward the ground below you. This will round out your lower back like an angry cat. Hold this position for __________ seconds.  Slowly lift your head and release your tail bone so that your back sags into a large arch, like an old horse.  Hold this position for __________ seconds.  Repeat this until you feel limber in your low back.  Now, find your "sweet spot." This will be the most comfortable position somewhere between the two previous positions. This is your neutral spine. Once you have found this position, tense your stomach muscles to support your low back.  Hold this position for __________ seconds. Repeat __________ times. Complete this exercise __________ times per day.  STRENGTHENING EXERCISES - Low Back Sprain These exercises may help you when beginning to rehabilitate your injury. These exercises should be done near your "sweet spot." This is the neutral, low-back arch, somewhere between fully rounded   and fully arched, that is your least painful position. When performed in this safe range of motion, these exercises can be used for people who have either a flexion or extension based injury. These exercises may resolve your symptoms with or without further involvement from your physician, physical therapist or athletic trainer. While completing these exercises, remember:   Muscles can gain both the endurance and the strength needed for everyday activities through controlled exercises.  Complete these exercises as instructed by your physician, physical therapist or athletic trainer. Increase the resistance and repetitions only as guided.  You may experience muscle soreness or fatigue, but the pain or discomfort you are trying to eliminate should never worsen during these exercises. If this pain does worsen, stop and make certain you are following the directions exactly. If the pain is still present after adjustments, discontinue the  exercise until you can discuss the trouble with your caregiver. STRENGTHENING - Deep Abdominals, Pelvic Tilt   Lie on a firm bed or floor. Keeping your legs in front of you, bend your knees so they are both pointed toward the ceiling and your feet are flat on the floor.  Tense your lower abdominal muscles to press your low back into the floor. This motion will rotate your pelvis so that your tail bone is scooping upwards rather than pointing at your feet or into the floor. With a gentle tension and even breathing, hold this position for __________ seconds. Repeat __________ times. Complete this exercise __________ times per day.  STRENGTHENING - Abdominals, Crunches   Lie on a firm bed or floor. Keeping your legs in front of you, bend your knees so they are both pointed toward the ceiling and your feet are flat on the floor. Cross your arms over your chest.  Slightly tip your chin down without bending your neck.  Tense your abdominals and slowly lift your trunk high enough to just clear your shoulder blades. Lifting higher can put excessive stress on the lower back and does not further strengthen your abdominal muscles.  Control your return to the starting position. Repeat __________ times. Complete this exercise __________ times per day.  STRENGTHENING - Quadruped, Opposite UE/LE Lift   Assume a hands and knees position on a firm surface. Keep your hands under your shoulders and your knees under your hips. You may place padding under your knees for comfort.  Find your neutral spine and gently tense your abdominal muscles so that you can maintain this position. Your shoulders and hips should form a rectangle that is parallel with the floor and is not twisted.  Keeping your trunk steady, lift your right hand no higher than your shoulder and then your left leg no higher than your hip. Make sure you are not holding your breath. Hold this position for __________ seconds.  Continuing to keep  your abdominal muscles tense and your back steady, slowly return to your starting position. Repeat with the opposite arm and leg. Repeat __________ times. Complete this exercise __________ times per day.  STRENGTHENING - Abdominals and Quadriceps, Straight Leg Raise   Lie on a firm bed or floor with both legs extended in front of you.  Keeping one leg in contact with the floor, bend the other knee so that your foot can rest flat on the floor.  Find your neutral spine, and tense your abdominal muscles to maintain your spinal position throughout the exercise.  Slowly lift your straight leg off the floor about 6 inches for a count   of 15, making sure to not hold your breath.  Still keeping your neutral spine, slowly lower your leg all the way to the floor. Repeat this exercise with each leg __________ times. Complete this exercise __________ times per day. POSTURE AND BODY MECHANICS CONSIDERATIONS - Low Back Sprain Keeping correct posture when sitting, standing or completing your activities will reduce the stress put on different body tissues, allowing injured tissues a chance to heal and limiting painful experiences. The following are general guidelines for improved posture. Your physician or physical therapist will provide you with any instructions specific to your needs. While reading these guidelines, remember:  The exercises prescribed by your provider will help you have the flexibility and strength to maintain correct postures.  The correct posture provides the best environment for your joints to work. All of your joints have less wear and tear when properly supported by a spine with good posture. This means you will experience a healthier, less painful body.  Correct posture must be practiced with all of your activities, especially prolonged sitting and standing. Correct posture is as important when doing repetitive low-stress activities (typing) as it is when doing a single heavy-load  activity (lifting). RESTING POSITIONS Consider which positions are most painful for you when choosing a resting position. If you have pain with flexion-based activities (sitting, bending, stooping, squatting), choose a position that allows you to rest in a less flexed posture. You would want to avoid curling into a fetal position on your side. If your pain worsens with extension-based activities (prolonged standing, working overhead), avoid resting in an extended position such as sleeping on your stomach. Most people will find more comfort when they rest with their spine in a more neutral position, neither too rounded nor too arched. Lying on a non-sagging bed on your side with a pillow between your knees, or on your back with a pillow under your knees will often provide some relief. Keep in mind, being in any one position for a prolonged period of time, no matter how correct your posture, can still lead to stiffness. PROPER SITTING POSTURE In order to minimize stress and discomfort on your spine, you must sit with correct posture. Sitting with good posture should be effortless for a healthy body. Returning to good posture is a gradual process. Many people can work toward this most comfortably by using various supports until they have the flexibility and strength to maintain this posture on their own. When sitting with proper posture, your ears will fall over your shoulders and your shoulders will fall over your hips. You should use the back of the chair to support your upper back. Your lower back will be in a neutral position, just slightly arched. You may place a small pillow or folded towel at the base of your lower back for  support.  When working at a desk, create an environment that supports good, upright posture. Without extra support, muscles tire, which leads to excessive strain on joints and other tissues. Keep these recommendations in mind: CHAIR:  A chair should be able to slide under your desk  when your back makes contact with the back of the chair. This allows you to work closely.  The chair's height should allow your eyes to be level with the upper part of your monitor and your hands to be slightly lower than your elbows. BODY POSITION  Your feet should make contact with the floor. If this is not possible, use a foot rest.  Keep your   ears over your shoulders. This will reduce stress on your neck and low back. INCORRECT SITTING POSTURES  If you are feeling tired and unable to assume a healthy sitting posture, do not slouch or slump. This puts excessive strain on your back tissues, causing more damage and pain. Healthier options include:  Using more support, like a lumbar pillow.  Switching tasks to something that requires you to be upright or walking.  Talking a brief walk.  Lying down to rest in a neutral-spine position. PROLONGED STANDING WHILE SLIGHTLY LEANING FORWARD  When completing a task that requires you to lean forward while standing in one place for a long time, place either foot up on a stationary 2-4 inch high object to help maintain the best posture. When both feet are on the ground, the lower back tends to lose its slight inward curve. If this curve flattens (or becomes too large), then the back and your other joints will experience too much stress, tire more quickly, and can cause pain. CORRECT STANDING POSTURES Proper standing posture should be assumed with all daily activities, even if they only take a few moments, like when brushing your teeth. As in sitting, your ears should fall over your shoulders and your shoulders should fall over your hips. You should keep a slight tension in your abdominal muscles to brace your spine. Your tailbone should point down to the ground, not behind your body, resulting in an over-extended swayback posture.  INCORRECT STANDING POSTURES  Common incorrect standing postures include a forward head, locked knees and/or an excessive  swayback. WALKING Walk with an upright posture. Your ears, shoulders and hips should all line-up. PROLONGED ACTIVITY IN A FLEXED POSITION When completing a task that requires you to bend forward at your waist or lean over a low surface, try to find a way to stabilize 3 out of 4 of your limbs. You can place a hand or elbow on your thigh or rest a knee on the surface you are reaching across. This will provide you more stability, so that your muscles do not tire as quickly. By keeping your knees relaxed, or slightly bent, you will also reduce stress across your lower back. CORRECT LIFTING TECHNIQUES DO :  Assume a wide stance. This will provide you more stability and the opportunity to get as close as possible to the object which you are lifting.  Tense your abdominals to brace your spine. Bend at the knees and hips. Keeping your back locked in a neutral-spine position, lift using your leg muscles. Lift with your legs, keeping your back straight.  Test the weight of unknown objects before attempting to lift them.  Try to keep your elbows locked down at your sides in order get the best strength from your shoulders when carrying an object.  Always ask for help when lifting heavy or awkward objects. INCORRECT LIFTING TECHNIQUES DO NOT:   Lock your knees when lifting, even if it is a small object.  Bend and twist. Pivot at your feet or move your feet when needing to change directions.  Assume that you can safely pick up even a paperclip without proper posture. Document Released: 02/17/2005 Document Revised: 05/12/2011 Document Reviewed: 06/01/2008 [employer] Patient Information 2015 [employer]. This information is not intended to replace advice given to you by your health care provider. Make sure you discuss any questions you have with your health care provider.  

## 2014-06-14 DIAGNOSIS — M47816 Spondylosis without myelopathy or radiculopathy, lumbar region: Secondary | ICD-10-CM | POA: Insufficient documentation

## 2014-06-14 DIAGNOSIS — M545 Low back pain: Secondary | ICD-10-CM

## 2014-06-14 DIAGNOSIS — M5136 Other intervertebral disc degeneration, lumbar region: Secondary | ICD-10-CM | POA: Insufficient documentation

## 2014-06-14 DIAGNOSIS — M62830 Muscle spasm of back: Secondary | ICD-10-CM | POA: Insufficient documentation

## 2014-06-14 DIAGNOSIS — G8929 Other chronic pain: Secondary | ICD-10-CM | POA: Insufficient documentation

## 2014-07-09 ENCOUNTER — Other Ambulatory Visit: Payer: Self-pay | Admitting: Physician Assistant

## 2014-07-10 ENCOUNTER — Other Ambulatory Visit: Payer: Self-pay

## 2014-07-10 MED ORDER — MELOXICAM 15 MG PO TABS: 15.0000 mg | ORAL_TABLET | Freq: Every day | ORAL | Status: DC

## 2014-08-18 ENCOUNTER — Encounter: Payer: Self-pay | Admitting: Family Medicine

## 2014-08-18 ENCOUNTER — Ambulatory Visit (INDEPENDENT_AMBULATORY_CARE_PROVIDER_SITE_OTHER): Payer: PRIVATE HEALTH INSURANCE | Admitting: Family Medicine

## 2014-08-18 VITALS — BP 146/82 | HR 91 | Ht 75.0 in | Wt 240.0 lb

## 2014-08-18 DIAGNOSIS — M109 Gout, unspecified: Secondary | ICD-10-CM

## 2014-08-18 DIAGNOSIS — Z0000 Encounter for general adult medical examination without abnormal findings: Secondary | ICD-10-CM

## 2014-08-18 MED ORDER — AMLODIPINE BESYLATE 10 MG PO TABS: 10.0000 mg | ORAL_TABLET | Freq: Every day | ORAL | Status: DC

## 2014-08-18 MED ORDER — PRAVASTATIN SODIUM 80 MG PO TABS: 80.0000 mg | ORAL_TABLET | Freq: Every day | ORAL | Status: DC

## 2014-08-18 NOTE — Patient Instructions (Signed)
Dr. Lynnea Vandervoort's General Advice Following Your Complete Physical Exam  The Benefits of Regular Exercise: Unless you suffer from an uncontrolled cardiovascular condition, studies strongly suggest that regular exercise and physical activity will add to both the quality and length of your life.  The World Health Organization recommends 150 minutes of moderate intensity aerobic activity every week.  This is best split over 3-4 days a week, and can be as simple as a brisk walk for just over 35 minutes "most days of the week".  This type of exercise has been shown to lower LDL-Cholesterol, lower average blood sugars, lower blood pressure, lower cardiovascular disease risk, improve memory, and increase one's overall sense of wellbeing.  The addition of anaerobic (or "strength training") exercises offers additional benefits including but not limited to increased metabolism, prevention of osteoporosis, and improved overall cholesterol levels.  How Can I Strive For A Low-Fat Diet?: Current guidelines recommend that 25-35 percent of your daily energy (food) intake should come from fats.  One might ask how can this be achieved without having to dissect each meal on a daily basis?  Switch to skim or 1% milk instead of whole milk.  Focus on lean meats such as ground turkey, fresh fish, baked chicken, and lean cuts of beef as your source of dietary protein.  Limit saturated fat consumption to less than 10% of your daily caloric intake.  Limit trans fatty acid consumption primarily by limiting synthetic trans fats such as partially hydrogenated oils (Ex: fried fast foods).  Substitute olive or vegetable oil for solid fats where possible.  Moderation of Salt Intake: Provided you don't carry a diagnosis of congestive heart failure nor renal failure, I recommend a daily allowance of no more than 2300 mg of salt (sodium).  Keeping under this daily goal is associated with a decreased risk of cardiovascular events, creeping  above it can lead to elevated blood pressures and increases your risk of cardiovascular events.  Milligrams (mg) of salt is listed on all nutrition labels, and your daily intake can add up faster than you think.  Most canned and frozen dinners can pack in over half your daily salt allowance in one meal.    Lifestyle Health Risks: Certain lifestyle choices carry specific health risks.  As you may already know, tobacco use has been associated with increasing one's risk of cardiovascular disease, pulmonary disease, numerous cancers, among many other issues.  What you may not know is that there are medications and nicotine replacement strategies that can more than double your chances of successfully quitting.  I would be thrilled to help manage your quitting strategy if you currently use tobacco products.  When it comes to alcohol use, I've yet to find an "ideal" daily allowance.  Provided an individual does not have a medical condition that is exacerbated by alcohol consumption, general guidelines determine "safe drinking" as no more than two standard drinks for a man or no more than one standard drink for a male per day.  However, much debate still exists on whether any amount of alcohol consumption is technically "safe".  My general advice, keep alcohol consumption to a minimum for general health promotion.  If you or others believe that alcohol, tobacco, or recreational drug use is interfering with your life, I would be happy to provide confidential counseling regarding treatment options.  General "Over The Counter" Nutrition Advice: Postmenopausal women should aim for a daily calcium intake of 1200 mg, however a significant portion of this might already be   provided by diets including milk, yogurt, cheese, and other dairy products.  Vitamin D has been shown to help preserve bone density, prevent fatigue, and has even been shown to help reduce falls in the elderly.  Ensuring a daily intake of 800 Units of  Vitamin D is a good place to start to enjoy the above benefits, we can easily check your Vitamin D level to see if you'd potentially benefit from supplementation beyond 800 Units a day.  Folic Acid intake should be of particular concern to women of childbearing age.  Daily consumption of 400-800 mcg of Folic Acid is recommended to minimize the chance of spinal cord defects in a fetus should pregnancy occur.    For many adults, accidents still remain one of the most common culprits when it comes to cause of death.  Some of the simplest but most effective preventitive habits you can adopt include regular seatbelt use, proper helmet use, securing firearms, and regularly testing your smoke and carbon monoxide detectors.  Thomas Noble B. Shiquita Collignon DO [facility] [address], [address] [place], [place] [address] Phone: [phone]  

## 2014-08-18 NOTE — Progress Notes (Signed)
CC: Thomas Noble is a 54 y.o. male is here for Annual Exam   Subjective: HPI:  Colonoscopy: Colonoscopy within the last 5 years and normal. Prostate: Discussed screening risks/beneifts with patient today, he would prefer to have this evaluated by the [facility] physical he has next week.   Influenza Vaccine: Up-to-date Pneumovax: No current indication Td/Tdap: Up-to-date as of 2 years ago Zoster: (Start 55 yo)  Presents for complete physical exam. He would prefer to have labs done to the [facility] physical but he has scheduled for next week. He tells me he is only here because his employer is making him get an annual physical through their insurance program. He has no complaints today.  Review of Systems - General ROS: negative for - chills, fever, night sweats, weight gain or weight loss Ophthalmic ROS: negative for - decreased vision Psychological ROS: negative for - anxiety or depression ENT ROS: negative for - hearing change, nasal congestion, tinnitus or allergies Hematological and Lymphatic ROS: negative for - bleeding problems, bruising or swollen lymph nodes Breast ROS: negative Respiratory ROS: no cough, shortness of breath, or wheezing Cardiovascular ROS: no chest pain or dyspnea on exertion Gastrointestinal ROS: no abdominal pain, change in bowel habits, or black or bloody stools Genito-Urinary ROS: negative for - genital discharge, genital ulcers, incontinence or abnormal bleeding from genitals Musculoskeletal ROS: negative for - joint pain or muscle pain Neurological ROS: negative for - headaches or memory loss Dermatological ROS: negative for lumps, mole changes, rash and skin lesion changes  No past medical history on file.  No past surgical history on file. No family history on file.  History   Social History  . Marital Status: Married    Spouse Name: N/A  . Number of Children: N/A  . Years of Education: N/A   Occupational History  . Not on  file.   Social History Main Topics  . Smoking status: Never Smoker   . Smokeless tobacco: Not on file  . Alcohol Use: Not on file  . Drug Use: Not on file  . Sexual Activity: Not on file   Other Topics Concern  . Not on file   Social History Narrative     Objective: BP 146/82 mmHg  Pulse 91  Ht  (1.905 m)  Wt 240 lb (108.863 kg)  BMI 30.00 kg/m2  General: No Acute Distress HEENT: Atraumatic, normocephalic, conjunctivae normal without scleral icterus.  No nasal discharge, hearing grossly intact, TMs with good landmarks bilaterally with no middle ear abnormalities, posterior pharynx clear without oral lesions. Neck: Supple, trachea midline, no cervical nor supraclavicular adenopathy. Pulmonary: Clear to auscultation bilaterally without wheezing, rhonchi, nor rales. Cardiac: Regular rate and rhythm.  No murmurs, rubs, nor gallops. No peripheral edema.  2+ peripheral pulses bilaterally. Abdomen: Bowel sounds normal.  No masses.  Non-tender without rebound.  Negative Murphy's sign. MSK: Grossly intact, no signs of weakness.  Full strength throughout upper and lower extremities.  Full ROM in upper and lower extremities.  No midline spinal tenderness. Neuro: Gait unremarkable, CN II-XII grossly intact.  C5-C6 Reflex 2/4 Bilaterally, L4 Reflex 2/4 Bilaterally.  Cerebellar function intact. Skin: No rashes. Psych: Alert and oriented to person/place/time.  Thought process normal. No anxiety/depression.  Assessment & Plan: Adonte was seen today for annual exam.  Diagnoses and all orders for this visit:  Gout without tophus, unspecified cause, unspecified chronicity, unspecified site  Annual physical exam  Other orders -     Cancel: Uric acid -  Cancel: COMPLETE METABOLIC PANEL WITH GFR -     Cancel: CBC -     Cancel: Lipid panel -     Cancel: PSA -     pravastatin (PRAVACHOL) 80 MG tablet; Take 1 tablet (80 mg total) by mouth daily. -     amLODipine (NORVASC) 10 MG  tablet; Take 1 tablet (10 mg total) by mouth daily.   Healthy lifestyle interventions including but not limited to regular exercise, a healthy low fat diet, moderation of salt intake, the dangers of tobacco/alcohol/recreational drug use, nutrition supplementation, and accident avoidance were discussed with the patient and a handout was provided for future reference.  Return in about 1 year (around 08/18/2015).

## 2014-09-01 ENCOUNTER — Encounter: Payer: Self-pay | Admitting: Family Medicine

## 2014-09-01 ENCOUNTER — Ambulatory Visit (INDEPENDENT_AMBULATORY_CARE_PROVIDER_SITE_OTHER): Payer: PRIVATE HEALTH INSURANCE | Admitting: Family Medicine

## 2014-09-01 VITALS — BP 141/93 | HR 87 | Wt 240.0 lb

## 2014-09-01 DIAGNOSIS — M5416 Radiculopathy, lumbar region: Secondary | ICD-10-CM | POA: Diagnosis not present

## 2014-09-01 MED ORDER — IBUPROFEN 800 MG PO TABS: 800.0000 mg | ORAL_TABLET | Freq: Three times a day (TID) | ORAL | Status: DC | PRN

## 2014-09-01 MED ORDER — METHOCARBAMOL 500 MG PO TABS: 500.0000 mg | ORAL_TABLET | Freq: Three times a day (TID) | ORAL | Status: DC | PRN

## 2014-09-01 NOTE — Progress Notes (Signed)
CC: Thomas CousinJoseph Noble is a 54 y.o. male is here for Back Pain   Subjective: HPI:  Left low back pain that he's been dealing with for matter of years. Pain was bad enough this morning that he felt he could not go to work. Symptoms have not gotten better or worse for the past year and they seem to fluctuate relative have a bad day every now and then one of those days being today. The frequency and severity of these flares have not worsened or gotten any better. It happens about 1 or 2 times a week. Symptoms are usually improved with ibuprofen or muscle relaxers however he had neither on hand this morning. Pain is localized in the left low back and radiates to the left buttock. This described as a sharpness and pulling sensation. No position particular makes it better or worse. He denies any motor or sensory disturbances in the right lower extremity oriented in the genital region. Denies any recent over exertion or trauma.    Review Of Systems Outlined In HPI  No past medical history on file.  No past surgical history on file. No family history on file.  History   Social History  . Marital Status: Married    Spouse Name: N/A  . Number of Children: N/A  . Years of Education: N/A   Occupational History  . Not on file.   Social History Main Topics  . Smoking status: Never Smoker   . Smokeless tobacco: Not on file  . Alcohol Use: Not on file  . Drug Use: Not on file  . Sexual Activity: Not on file   Other Topics Concern  . Not on file   Social History Narrative     Objective: BP 141/93 mmHg  Pulse 87  Wt 240 lb (108.863 kg)  Vital signs reviewed. General: Alert and Oriented, No Acute Distress HEENT: Pupils equal, round, reactive to light. Conjunctivae clear.  External ears unremarkable.  Moist mucous membranes. Lungs: Clear and comfortable work of breathing, speaking in full sentences without accessory muscle use. Cardiac: Regular rate and rhythm.  Neuro: CN II-XII grossly  intact, gait normal. Extremities: No peripheral edema.  Strong peripheral pulses.  Back: No midline spinous process tenderness, no reproduction of pain with palpation of the paraspinal musculature. Straight leg raise is negative on the left. Full range of motion and strength of both lower extremities. Internal and external rotation of the left femur does not reproduce pain. No pain with bending forward or extension of the back. Mental Status: No depression, anxiety, nor agitation. Logical though process. Skin: Warm and dry.  Assessment & Plan: Thomas LongsJoseph was seen today for back pain.  Diagnoses and all orders for this visit:  Left lumbar radiculitis Orders: -     ibuprofen (ADVIL,MOTRIN) 800 MG tablet; Take 1 tablet (800 mg total) by mouth every 8 (eight) hours as needed. -     methocarbamol (ROBAXIN) 500 MG tablet; Take 1 tablet (500 mg total) by mouth every 8 (eight) hours as needed for muscle spasms.   Left lumbar radiculitis, since symptoms have not been worsened over the last year will treat with the above knowing that this has been fully effective for his episodic flareups in the past.  Return if symptoms worsen or fail to improve.

## 2014-10-07 ENCOUNTER — Other Ambulatory Visit: Payer: Self-pay | Admitting: Family Medicine

## 2014-10-13 ENCOUNTER — Other Ambulatory Visit: Payer: Self-pay | Admitting: Family Medicine

## 2014-10-13 DIAGNOSIS — M5416 Radiculopathy, lumbar region: Secondary | ICD-10-CM

## 2014-10-13 MED ORDER — METHOCARBAMOL 500 MG PO TABS: 500.0000 mg | ORAL_TABLET | Freq: Three times a day (TID) | ORAL | Status: DC | PRN

## 2014-10-13 MED ORDER — IBUPROFEN 800 MG PO TABS: 800.0000 mg | ORAL_TABLET | Freq: Three times a day (TID) | ORAL | Status: DC | PRN

## 2014-10-27 ENCOUNTER — Encounter: Payer: Self-pay | Admitting: Family Medicine

## 2014-12-27 ENCOUNTER — Encounter: Payer: Self-pay | Admitting: Family Medicine

## 2014-12-27 ENCOUNTER — Ambulatory Visit (INDEPENDENT_AMBULATORY_CARE_PROVIDER_SITE_OTHER): Payer: PRIVATE HEALTH INSURANCE | Admitting: Family Medicine

## 2014-12-27 VITALS — BP 147/86 | HR 81 | Wt 247.0 lb

## 2014-12-27 DIAGNOSIS — M545 Low back pain, unspecified: Secondary | ICD-10-CM

## 2014-12-27 MED ORDER — KETOROLAC TROMETHAMINE 60 MG/2ML IM SOLN
60.0000 mg | Freq: Once | INTRAMUSCULAR | Status: AC
  Administered 2014-12-27: 60 mg via INTRAMUSCULAR

## 2014-12-27 MED ORDER — PREDNISONE 20 MG PO TABS: ORAL_TABLET | ORAL | Status: AC

## 2014-12-27 NOTE — Progress Notes (Signed)
CC: Thomas CousinJoseph Noble is a 54 y.o. male is here for Hip Pain and Back Pain   Subjective: HPI:  Left low back pain that he's been dealing with for years. It usually flares up 1 or 2 times a year. It typically respond to corticosteroids, muscle relaxers. It's currently located in the left low back and radiating into the left buttock. It's worse when going from a seated to standing position. It improves more he is up walking around. Bending forward and backwards are equally as painful. Pain is moderate in severity. Symptoms have been present for 4 days during this flare. He denies any motor or sensory disturbances in the lower extremities. Denies midline back pain.   Review Of Systems Outlined In HPI  No past medical history on file.  No past surgical history on file. No family history on file.  Social History   Social History  . Marital Status: Married    Spouse Name: N/A  . Number of Children: N/A  . Years of Education: N/A   Occupational History  . Not on file.   Social History Main Topics  . Smoking status: Never Smoker   . Smokeless tobacco: Not on file  . Alcohol Use: Not on file  . Drug Use: Not on file  . Sexual Activity: Not on file   Other Topics Concern  . Not on file   Social History Narrative     Objective: BP 147/86 mmHg  Pulse 81  Wt 247 lb (112.038 kg)  Vital signs reviewed. General: Alert and Oriented, No Acute Distress HEENT: Pupils equal, round, reactive to light. Conjunctivae clear.  External ears unremarkable.  Moist mucous membranes. Lungs: Clear and comfortable work of breathing, speaking in full sentences without accessory muscle use. Cardiac: Regular rate and rhythm.  Neuro: CN II-XII grossly intact, gait normal. Extremities: No peripheral edema.  Strong peripheral pulses.  Mental Status: No depression, anxiety, nor agitation. Logical though process. Skin: Warm and dry. Back: Midline spinous process tenderness is absent. Mild reproduction of pain  with palpation of the left paraspinal musculature which is moderately hypertonic and tense. Full range of motion and strength in all 3 planes of the lumbar spine  Assessment & Plan: [name] was seen today for hip pain and back pain.  Diagnoses and all orders for this visit:  Left-sided low back pain without sciatica -     predniSONE (DELTASONE) 20 MG tablet; Three tabs daily days 1-3, two tabs daily days 4-6, one tab daily days 7-9, half tab daily days 10-13. -     ketorolac (TORADOL) injection 60 mg; Inject 2 mLs (60 mg total) into the muscle once.   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 I discussed the possibility being sent to a specialist to get an MRI and then possible facet joint injections or similar injections every platelet declines at this time  Return if symptoms worsen or fail to improve.

## 2015-02-12 ENCOUNTER — Encounter: Payer: Self-pay | Admitting: Physician Assistant

## 2015-02-12 ENCOUNTER — Ambulatory Visit (INDEPENDENT_AMBULATORY_CARE_PROVIDER_SITE_OTHER): Payer: PRIVATE HEALTH INSURANCE | Admitting: Physician Assistant

## 2015-02-12 ENCOUNTER — Ambulatory Visit (INDEPENDENT_AMBULATORY_CARE_PROVIDER_SITE_OTHER): Payer: PRIVATE HEALTH INSURANCE

## 2015-02-12 VITALS — BP 150/73 | HR 86 | Ht 75.0 in | Wt 247.0 lb

## 2015-02-12 DIAGNOSIS — M545 Low back pain, unspecified: Secondary | ICD-10-CM

## 2015-02-12 DIAGNOSIS — M5137 Other intervertebral disc degeneration, lumbosacral region: Secondary | ICD-10-CM | POA: Diagnosis not present

## 2015-02-12 DIAGNOSIS — M47896 Other spondylosis, lumbar region: Secondary | ICD-10-CM | POA: Diagnosis not present

## 2015-02-12 MED ORDER — PREDNISONE 20 MG PO TABS: ORAL_TABLET | ORAL | Status: DC

## 2015-02-12 MED ORDER — MELOXICAM 15 MG PO TABS: ORAL_TABLET | ORAL | Status: DC

## 2015-02-12 MED ORDER — KETOROLAC TROMETHAMINE 60 MG/2ML IM SOLN
60.0000 mg | Freq: Once | INTRAMUSCULAR | Status: AC
  Administered 2015-02-12: 60 mg via INTRAMUSCULAR

## 2015-02-12 NOTE — Patient Instructions (Signed)
Make appt with Dr. Denyse Amassorey for further back pain management.  Will get scheduled with PT.

## 2015-02-15 NOTE — Progress Notes (Signed)
   Subjective:    Patient ID: Thomas Noble, male    DOB: 1960-10-14, [age] y.o.   MRN: [id number]  HPI Pt is a 54 yo male who presents to the clinic with exacerbation of left lumbar back pain. He usually has these episodes a few times a year for the last 20 years. Recently exacerbations have been increasing. He has robaxin that helps some. He has been taking ibuprofen which also helps. Pain is focused on left lower back and buttocks. He denies any recent back injury but in general he is active and continues to lift and twist in normal every day life. Standing makes pain better. Twisting and bending makes worse. Prednisone and toradol have typically always helped a lot.    Review of Systems  All other systems reviewed and are negative.      Objective:   Physical Exam  Constitutional: He is oriented to person, place, and time. He appears well-developed and well-nourished.  HENT:  Head: Normocephalic and atraumatic.  Cardiovascular: Normal rate, regular rhythm and normal heart sounds.   Pulmonary/Chest: Effort normal and breath sounds normal.  Musculoskeletal:  No pain with palpation over lumbar spine.  Pain with forward flexion at waist.  Pain radiates from left low back into left buttocks but does not run down legs.  Tightness with straight leg test on the left and right but no pain.  Strength of lower extremity 5/5, bilaterally.   Neurological: He is alert and oriented to person, place, and time.  Psychiatric: He has a normal mood and affect. His behavior is normal.          Assessment & Plan:  Left-sided low back pain without scaitca- appears to be more muscular spasm than symptoms of herniated disc. no imaging done recently. Will get lumbar xray. I suggest PT here in the building. Will order but pt does not seem very motivated. Discussed when this was happening once or twice a year different than every month. Need to strengthen muscle of the lower back. toradol 60mg  IM given in  office today. Continue with robaxin. mobic daily as needed for back pain. Prednisone taper given. Follow up with sports medicine for further evaluation and treatment.

## 2015-02-19 ENCOUNTER — Ambulatory Visit (INDEPENDENT_AMBULATORY_CARE_PROVIDER_SITE_OTHER): Payer: PRIVATE HEALTH INSURANCE | Admitting: Family Medicine

## 2015-02-19 ENCOUNTER — Encounter: Payer: Self-pay | Admitting: Family Medicine

## 2015-02-19 VITALS — BP 147/77 | HR 87 | Wt 254.0 lb

## 2015-02-19 DIAGNOSIS — M545 Low back pain, unspecified: Secondary | ICD-10-CM

## 2015-02-19 DIAGNOSIS — M62830 Muscle spasm of back: Secondary | ICD-10-CM | POA: Diagnosis not present

## 2015-02-19 DIAGNOSIS — M5137 Other intervertebral disc degeneration, lumbosacral region: Secondary | ICD-10-CM | POA: Diagnosis not present

## 2015-02-19 DIAGNOSIS — G8929 Other chronic pain: Secondary | ICD-10-CM

## 2015-02-19 NOTE — Patient Instructions (Signed)
Thank you for coming in today. Come back or go to the emergency room if you notice new weakness new numbness problems walking or bowel or bladder problems. Attend PT.   TENS UNIT: This is helpful for muscle pain and spasm.   Search and Purchase a TENS 7000 2nd edition at www.tenspros.com. It should be less than $30.     TENS unit instructions: Do not shower or bathe with the unit on Turn the unit off before removing electrodes or batteries If the electrodes lose stickiness add a drop of water to the electrodes after they are disconnected from the unit and place on plastic sheet. If you continued to have difficulty, call the TENS unit company to purchase more electrodes. Do not apply lotion on the skin area prior to use. Make sure the skin is clean and dry as this will help prolong the life of the electrodes. After use, always check skin for unusual red areas, rash or other skin difficulties. If there are any skin problems, does not apply electrodes to the same area. Never remove the electrodes from the unit by pulling the wires. Do not use the TENS unit or electrodes other than as directed. Do not change electrode placement without consultating your therapist or physician. Keep 2 fingers with between each electrode. Wear time ratio is 2:1, on to off times.    For example on for 30 minutes off for 15 minutes and then on for 30 minutes off for 15 minutes

## 2015-02-20 ENCOUNTER — Encounter: Payer: Self-pay | Admitting: Rehabilitative and Restorative Service Providers"

## 2015-02-20 ENCOUNTER — Ambulatory Visit (INDEPENDENT_AMBULATORY_CARE_PROVIDER_SITE_OTHER): Payer: PRIVATE HEALTH INSURANCE | Admitting: Rehabilitative and Restorative Service Providers"

## 2015-02-20 DIAGNOSIS — Z7409 Other reduced mobility: Secondary | ICD-10-CM | POA: Diagnosis not present

## 2015-02-20 DIAGNOSIS — M545 Low back pain, unspecified: Secondary | ICD-10-CM

## 2015-02-20 DIAGNOSIS — M623 Immobility syndrome (paraplegic): Secondary | ICD-10-CM

## 2015-02-20 DIAGNOSIS — M25559 Pain in unspecified hip: Secondary | ICD-10-CM | POA: Diagnosis not present

## 2015-02-20 DIAGNOSIS — M2560 Stiffness of unspecified joint, not elsewhere classified: Secondary | ICD-10-CM

## 2015-02-20 NOTE — Patient Instructions (Signed)
Abdominal Bracing With Pelvic Floor (Hook-Lying)    With neutral spine, tighten pelvic floor and abdominals sucking belly button to back bone, tighten muscles in back at waist. Hold 10 sec Repeat __10_ times. Do _several__ times a day. Progress to do in sitting; standing; walking and working   Trunk: Prone Extension (Press-Ups)    Lie on stomach on firm, flat surface. Relax bottom and legs. Raise chest in air with elbows straight. Keep hips flat on surface, sag stomach. Hold __2-3__ seconds. Repeat _10___ times. Do _2-3 ___ sessions per day. CAUTION: Movement should be gentle and slow.  HIP: Hamstrings - Supine    Place strap around foot. Raise leg up, keep knee straight. Hold _30__ seconds. _3__ reps per set, __2-3_ sets per day Can bend opposite knee   Outer Hip Stretch: Reclined IT Band Stretch (Strap)    Strap around opposite foot, pull across only as far as possible with shoulders on mat. Hold for __30 sec Repeat _3___ times each leg. 

## 2015-02-20 NOTE — Assessment & Plan Note (Signed)
Patient has mild chronic back pain. 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 Overall this will not improve much however his frequent exacerbations will likely improve significantly with physical therapy. Recheck in 4 weeks.

## 2015-02-20 NOTE — Assessment & Plan Note (Signed)
As noted elsewhere. Continue to follow.

## 2015-02-20 NOTE — Assessment & Plan Note (Signed)
Patient has a daily supination of chronic back pain. Overall his chronic pain is typically mild however he has frequent exacerbations. He already has maximal medical therapy. Plan for TENS unit, and a heating pad, and referral to physical therapy. I am very optimistic about his likelihood to have significant improvement with physical therapy. Check in 4 weeks.

## 2015-02-20 NOTE — Progress Notes (Signed)
   Subjective:    I'm seeing this patient as a consultation for:  Tandy GawJade Breeback PA-C  CC: Back pain  HPI: Patient notes a several year long history of bilateral low back pain. He notes he has typically mild chronic back pain however he has frequent exacerbations. Previously these occurred once or twice a year however over the past year or recently they've occurred almost every month. He misses approximately 3-4 days of work every 3 months because of his back pain episodes. He notes the pain is located in the bilateral lower back without radiation. He denies any weakness or numbness bowel bladder dysfunction fevers chills nausea vomiting or diarrhea. He's had a trial of ibuprofen meloxicam and methocarbamol and prednisone all of which have helped temporarily. He's never had formal physical therapy. He was recently evaluated by his primary care provider who obtained a lumbar x-ray that showed diffuse DDD in his lumbar back. Overall he feels well but the pain episodes have become more severe frequent and long lasting.  Past medical history, Surgical history, Family history not pertinant except as noted below, Social history, Allergies, and medications have been entered into the medical record, reviewed, and no changes needed.   Review of Systems: No headache, visual changes, nausea, vomiting, diarrhea, constipation, dizziness, abdominal pain, skin rash, fevers, chills, night sweats, weight loss, swollen lymph nodes, body aches, joint swelling, muscle aches, chest pain, shortness of breath, mood changes, visual or auditory hallucinations.   Objective:    Filed Vitals:   02/19/15 1603  BP: 147/77  Pulse: 87   General: Well Developed, well nourished, and in no acute distress.  Neuro/Psych: Alert and oriented x3, extra-ocular muscles intact, able to move all 4 extremities, sensation grossly intact. Skin: Warm and dry, no rashes noted.  Respiratory: Not using accessory muscles, speaking in full  sentences, trachea midline.  Cardiovascular: Pulses palpable, no extremity edema. Abdomen: Does not appear distended. However obese. MSK: Back: Normal-appearing nontender to spinal midline. Tender palpation bilateral quadratus lumborum. Range of motion of the back is intact however patient has pain with rotation and lateral flexion bilaterally. Negative stork test. Lower extremity strength and reflexes are equal and normal bilaterally. Patient can stand on his toes and heels squat and get on and off exam table easily. Sensation is intact throughout bilateral lower extremities. Normal capillary refill bilateral lower extremities.  X-ray lumbar spine dated 02/12/2015 reviewed.  No results found for this or any previous visit (from the past 24 hour(s)). No results found.  Impression and Recommendations:   This case required medical decision making of moderate complexity.
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
## 2015-03-01 ENCOUNTER — Ambulatory Visit (INDEPENDENT_AMBULATORY_CARE_PROVIDER_SITE_OTHER): Payer: PRIVATE HEALTH INSURANCE | Admitting: Physical Therapy

## 2015-03-01 ENCOUNTER — Encounter: Payer: Self-pay | Admitting: Family Medicine

## 2015-03-01 DIAGNOSIS — Z7409 Other reduced mobility: Secondary | ICD-10-CM

## 2015-03-01 DIAGNOSIS — M623 Immobility syndrome (paraplegic): Secondary | ICD-10-CM

## 2015-03-01 DIAGNOSIS — M2560 Stiffness of unspecified joint, not elsewhere classified: Secondary | ICD-10-CM

## 2015-03-01 DIAGNOSIS — M545 Low back pain, unspecified: Secondary | ICD-10-CM

## 2015-03-01 DIAGNOSIS — M25559 Pain in unspecified hip: Secondary | ICD-10-CM
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
## 2015-03-01 NOTE — Patient Instructions (Signed)
Abdominal Bracing With Pelvic Floor (Hook-Lying)    With neutral spine, tighten pelvic floor and abdominals sucking belly button to back bone, tighten muscles in back at waist. Hold 10 sec Repeat __10_ times. Do _several__ times a day. Progress to do in sitting; standing; walking and working   Trunk: Prone Extension (Press-Ups)    Lie on stomach on firm, flat surface. Relax bottom and legs. Raise chest in air with elbows straight. Keep hips flat on surface, sag stomach. Hold __2-3__ seconds. Repeat _10___ times. Do _2-3 ___ sessions per day. CAUTION: Movement should be gentle and slow.  HIP: Hamstrings - Supine    Place strap around foot. Raise leg up, keep knee straight. Hold _30__ seconds. _3__ reps per set, __2-3_ sets per day Can bend opposite knee   Outer Hip Stretch: Reclined IT Band Stretch (Strap)    Strap around opposite foot, pull across only as far as possible with shoulders on mat. Hold for __30 sec Repeat _3___ times each leg.

## 2015-03-06 ENCOUNTER — Ambulatory Visit (INDEPENDENT_AMBULATORY_CARE_PROVIDER_SITE_OTHER): Payer: PRIVATE HEALTH INSURANCE | Admitting: Physician Assistant

## 2015-03-06 ENCOUNTER — Encounter: Payer: Self-pay | Admitting: Physician Assistant

## 2015-03-06 VITALS — BP 143/79 | HR 86 | Ht 75.0 in | Wt 245.0 lb

## 2015-03-06 DIAGNOSIS — J208 Acute bronchitis due to other specified organisms: Secondary | ICD-10-CM | POA: Diagnosis not present

## 2015-03-06 MED ORDER — HYDROCOD POLST-CPM POLST ER 10-8 MG/5ML PO SUER: 5.0000 mL | Freq: Two times a day (BID) | ORAL | Status: DC | PRN

## 2015-03-06 MED ORDER — AMOXICILLIN-POT CLAVULANATE 875-125 MG PO TABS: 1.0000 | ORAL_TABLET | Freq: Two times a day (BID) | ORAL | Status: DC

## 2015-03-06 NOTE — Patient Instructions (Signed)
Please write out for today and tomorrow.   mucinex DM  Cough syrup tonight.  Start abx.   Upper Respiratory Infection, Adult Most upper respiratory infections (URIs) are a viral infection of the air passages leading to the lungs. A URI affects the nose, throat, and upper air passages. The most common type of URI is nasopharyngitis and is typically referred to as "the common cold." URIs run their course and usually go away on their own. Most of the time, a URI does not require medical attention, but sometimes a bacterial infection in the upper airways can follow a viral infection. This is called a secondary infection. Sinus and middle ear infections are common types of secondary upper respiratory infections. Bacterial pneumonia can also complicate a URI. A URI can worsen asthma and chronic obstructive pulmonary disease (COPD). Sometimes, these complications can require emergency medical care and may be life threatening.  CAUSES Almost all URIs are caused by viruses. A virus is a type of germ and can spread from one person to another.  RISKS FACTORS You may be at risk for a URI if:   You smoke.   You have chronic heart or lung disease.  You have a weakened defense (immune) system.   You are very young or very old.   You have nasal allergies or asthma.  You work in crowded or poorly ventilated areas.  You work in health care facilities or schools. SIGNS AND SYMPTOMS  Symptoms typically develop 2-3 days after you come in contact with a cold virus. Most viral URIs last 7-10 days. However, viral URIs from the influenza virus (flu virus) can last 14-18 days and are typically more severe. Symptoms may include:   Runny or stuffy (congested) nose.   Sneezing.   Cough.   Sore throat.   Headache.   Fatigue.   Fever.   Loss of appetite.   Pain in your forehead, behind your eyes, and over your cheekbones (sinus pain).  Muscle aches.  DIAGNOSIS  Your health care  provider may diagnose a URI by:  Physical exam.  Tests to check that your symptoms are not due to another condition such as:  Strep throat.  Sinusitis.  Pneumonia.  Asthma. TREATMENT  A URI goes away on its own with time. It cannot be cured with medicines, but medicines may be prescribed or recommended to relieve symptoms. Medicines may help:  Reduce your fever.  Reduce your cough.  Relieve nasal congestion. HOME CARE INSTRUCTIONS   Take medicines only as directed by your health care provider.   Gargle warm saltwater or take cough drops to comfort your throat as directed by your health care provider.  Use a warm mist humidifier or inhale steam from a shower to increase air moisture. This may make it easier to breathe.  Drink enough fluid to keep your urine clear or pale yellow.   Eat soups and other clear broths and maintain good nutrition.   Rest as needed.   Return to work when your temperature has returned to normal or as your health care provider advises. You may need to stay home longer to avoid infecting others. You can also use a face mask and careful hand washing to prevent spread of the virus.  Increase the usage of your inhaler if you have asthma.   Do not use any tobacco products, including cigarettes, chewing tobacco, or electronic cigarettes. If you need help quitting, ask your health care provider. PREVENTION  The best way to protect yourself  from getting a cold is to practice good hygiene.   Avoid oral or hand contact with people with cold symptoms.   Wash your hands often if contact occurs.  There is no clear evidence that vitamin C, vitamin E, echinacea, or exercise reduces the chance of developing a cold. However, it is always recommended to get plenty of rest, exercise, and practice good nutrition.  SEEK MEDICAL CARE IF:   You are getting worse rather than better.   Your symptoms are not controlled by medicine.   You have chills.  You  have worsening shortness of breath.  You have brown or red mucus.  You have yellow or brown nasal discharge.  You have pain in your face, especially when you bend forward.  You have a fever.  You have swollen neck glands.  You have pain while swallowing.  You have white areas in the back of your throat. SEEK IMMEDIATE MEDICAL CARE IF:   You have severe or persistent:  Headache.  Ear pain.  Sinus pain.  Chest pain.  You have chronic lung disease and any of the following:  Wheezing.  Prolonged cough.  Coughing up blood.  A change in your usual mucus.  You have a stiff neck.  You have changes in your:  Vision.  Hearing.  Thinking.  Mood. MAKE SURE YOU:   Understand these instructions.  Will watch your condition.  Will get help right away if you are not doing well or get worse.   This information is not intended to replace advice given to you by your health care provider. Make sure you discuss any questions you have with your health care provider.   Document Released: 08/13/2000 Document Revised: 07/04/2014 Document Reviewed: 05/25/2013 Elsevier Interactive Patient Education [employer]

## 2015-03-07 ENCOUNTER — Encounter: Payer: Self-pay | Admitting: Physician Assistant

## 2015-03-07 NOTE — Progress Notes (Signed)
   Subjective:    Patient ID: Thomas Noble, male    DOB: 1961-01-07, 55 y.o.   MRN: [id number]  HPI Pt is a 55 yo male who presents to the clinic with cough and chest congestion for 6 days. Cough is the worst symptoms. He does report is productive. Cough keeps him hup at night. He feels tired and worn down. No appetitie. Some mild sinus pressure and ear fullness. Taking alka seltzer cold and flu regularly with some relief. No fever.    Review of Systems    see HPI. Objective:   Physical Exam  Constitutional: He appears well-developed and well-nourished.  HENT:  Head: Normocephalic and atraumatic.  Right Ear: External ear normal.  Left Ear: External ear normal.  Nose: Nose normal.  Mouth/Throat: Oropharynx is clear and moist. No oropharyngeal exudate.  TM's erythematous with light reflex present.  Oropharynx eyrthematous with PND.  Bilateral nares red and swollen.  Negative fore tenderness of sinuses.   Eyes:  bilatearl conjunctiva injected with watery discharge.   Neck: Normal range of motion. Neck supple.  Cardiovascular: Normal rate, regular rhythm and normal heart sounds.   Pulmonary/Chest: Effort normal. He has no wheezes.  Bilateral rhonchi.   Lymphadenopathy:    He has no cervical adenopathy.          Assessment & Plan:  Acute bronchitis- treated with augmentin and tussionex. Symptomatic care discussed. Written out of work for today and tomorrow.

## 2015-03-08 ENCOUNTER — Encounter: Payer: PRIVATE HEALTH INSURANCE | Admitting: Rehabilitative and Restorative Service Providers"

## 2015-03-10 ENCOUNTER — Other Ambulatory Visit: Payer: Self-pay | Admitting: Physician Assistant

## 2015-03-12 ENCOUNTER — Encounter: Payer: PRIVATE HEALTH INSURANCE | Admitting: Physical Therapy

## 2015-03-15 ENCOUNTER — Encounter: Payer: PRIVATE HEALTH INSURANCE | Admitting: Rehabilitative and Restorative Service Providers"

## 2015-03-19 ENCOUNTER — Ambulatory Visit (INDEPENDENT_AMBULATORY_CARE_PROVIDER_SITE_OTHER): Payer: PRIVATE HEALTH INSURANCE | Admitting: Rehabilitative and Restorative Service Providers"

## 2015-03-19 ENCOUNTER — Encounter: Payer: Self-pay | Admitting: Family Medicine

## 2015-03-19 ENCOUNTER — Ambulatory Visit (INDEPENDENT_AMBULATORY_CARE_PROVIDER_SITE_OTHER): Payer: PRIVATE HEALTH INSURANCE | Admitting: Family Medicine

## 2015-03-19 VITALS — BP 152/99 | HR 83 | Wt 245.0 lb

## 2015-03-19 DIAGNOSIS — M545 Low back pain, unspecified: Secondary | ICD-10-CM

## 2015-03-19 DIAGNOSIS — G8929 Other chronic pain: Secondary | ICD-10-CM

## 2015-03-19 DIAGNOSIS — Z7409 Other reduced mobility: Secondary | ICD-10-CM | POA: Diagnosis not present

## 2015-03-19 DIAGNOSIS — M25559 Pain in unspecified hip: Secondary | ICD-10-CM

## 2015-03-19 DIAGNOSIS — M2560 Stiffness of unspecified joint, not elsewhere classified: Secondary | ICD-10-CM

## 2015-03-19 DIAGNOSIS — M623 Immobility syndrome (paraplegic): Secondary | ICD-10-CM | POA: Diagnosis not present

## 2015-03-19 DIAGNOSIS — M5137 Other intervertebral disc degeneration, lumbosacral region: Secondary | ICD-10-CM

## 2015-03-19 NOTE — Patient Instructions (Signed)
Flexors stretch     Lie prone, one arm forward, other hand grasping same-side foot. Use strap at toe; pulling heel toward hip  Hold _30  seconds.  Repeat _3__ times per session. Do _2 sessions per day.

## 2015-03-19 NOTE — Progress Notes (Signed)
       Thomas Noble is a 55 y.o. male who [facility] today for follow-up back pain. Patient presents for one-month follow-up for chronic low back pain with exacerbations. He's been to several physical therapy sessions. He just realized he was supposed to be going home exercises. Overall he states that he is improved but would like to continue physical therapy.   No past medical history on file. No past surgical history on file. Social History  Substance Use Topics  . Smoking status: Never Smoker   . Smokeless tobacco: Not on file  . Alcohol Use: Not on file   family history is not on file.  ROS as above Medications: Current Outpatient Prescriptions  Medication Sig Dispense Refill  . allopurinol (ZYLOPRIM) 300 MG tablet Take 300 mg by mouth daily.    [name]. amLODipine (NORVASC) 10 MG tablet TAKE 1 TABLET (10 MG TOTAL) BY MOUTH DAILY. 30 tablet 2  . ibuprofen (ADVIL,MOTRIN) 800 MG tablet Take 800 mg by mouth every 8 (eight) hours as needed.    . methocarbamol (ROBAXIN) 500 MG tablet Take 1 tablet (500 mg total) by mouth every 8 (eight) hours as needed for muscle spasms. 45 tablet 1  . pravastatin (PRAVACHOL) 80 MG tablet TAKE 1 TABLET (80 MG TOTAL) BY MOUTH DAILY. 30 tablet 2   No current facility-administered medications for this visit.   No Known Allergies   Exam:  BP 152/99 mmHg  Pulse 83  Wt 245 lb (111.131 kg) Gen: Well NAD Back: Nontender to spinal midline. Tender palpation right lumbar paraspinal. Normal back range of motion but pain with extension. Normal gait.  No results found for this or any previous visit (from the past 24 hour(s)). No results found.   Please see individual assessment and plan sections.

## 2015-03-19 NOTE — Assessment & Plan Note (Signed)
Doing well. Continue physical therapy. Return as needed. Follow-up with PCP regarding blood pressure.

## 2015-03-19 NOTE — Therapy (Addendum)
[place] [facility] [place], [place], [address] Phone: [phone]   Fax:  [phone]  Physical Therapy Treatment  Patient Details  Name: Thomas Noble MRN: [id number] Date of Birth: [birth year] Referring Provider: Georgina Snell  Encounter Date: 03/19/2015      PT End of Session - 03/19/15 1607    Visit Number 3   Number of Visits 12   Date for PT Re-Evaluation 04/04/15   PT Start Time 4580   PT Stop Time 1607   PT Time Calculation (min) 52 min   Activity Tolerance Patient tolerated treatment well      No past medical history on file.  No past surgical history on file.  There were no vitals filed for this visit.  Visit Diagnosis:  Bilateral low back pain without sciatica  Hip pain, unspecified laterality  Stiffness due to immobility  Decreased functional mobility and endurance      Subjective Assessment - 03/19/15 1532    Subjective Patient reports that he has not had any change in the pain in his LB and mainly Rt hip. Had a sharp pain for about 10 min this morning. Does not have time or feel like doing any exercises at home - too tired from work.    Currently in Pain? Yes   Pain Score 7    Pain Location Back   Pain Orientation Right   Pain Radiating Towards radiates into the Rt hip and around sides    Pain Onset More than a month ago   Pain Frequency Constant            [facility] PT Assessment - 03/19/15 0001    Assessment   Medical Diagnosis LBP   Referring Provider Georgina Snell   Onset Date/Surgical Date 02/10/15   Hand Dominance Right   Next MD Visit 03/19/15   Prior Therapy none   Precautions   Precautions None   AROM   Overall AROM  --  no significant change in trunk motion    Flexibility   Hamstrings Rt 75 deg; Lt 72 deg    Quadriceps heel to buttock Rt 8 in; Lt 7 inches    ITB tight bilat    Piriformis tight bilat Rt > L:t    Palpation   Spinal mobility generalized tightness throughout lumbar  spine   Palpation comment tight lats/QU/piriformis/hip abductors/hip flexors                     [facility] Adult PT Treatment/Exercise - 03/19/15 0001    Lumbar Exercises: Stretches   Passive Hamstring Stretch 3 reps   Press Ups Limitations 2-3 sec   Quad Stretch 3 reps;30 seconds   ITB Stretch 2 reps   Lumbar Exercises: Aerobic   Stationary Bike Nu Step L5 X 5 '   Lumbar Exercises: Supine   AB Set Limitations 3 part   Lumbar Exercises: Prone   Other Prone Lumbar Exercises Press ups  10 X gentle with short holds                PT Education - 03/19/15 1601    Education provided Yes   Education Details importance of consistent HEP; added quad stretch    Person(s) Educated Patient   Methods Explanation;Demonstration;Tactile cues;Verbal cues;Handout   Comprehension Verbalized understanding;Returned demonstration;Verbal cues required;Tactile cues required             PT Long Term Goals - 03/19/15 1616    PT LONG TERM GOAL #1  Title Improve trunk mobility by 20-25% 04/04/15   Time 6   Period Weeks   Status On-going   PT LONG TERM GOAL #2   Title Improve flexibility through hip flexors to 10 - 15 degrees and hamstrings to 80 degrees bilat 04/04/15   Time 6   Period Weeks   Status On-going   PT LONG TERM GOAL #3   Title Improve core stability and progress with stabilization program 04/04/15    Time 6   Period Weeks   Status On-going   PT LONG TERM GOAL #4   Title Patient to demonstrate proper body mechanics for positional changes; lifting; turning; squatting 04/04/15   Time 6   Period Weeks   Status On-going   PT LONG TERM GOAL #5   Title Improve FOTO to </=32% limitation 04/04/15   Time 6   Period Weeks   Status On-going               Plan - 03/19/15 1607    Clinical Impression Statement No significant change in pain; ROM; core stability; functional level; Patient is not doing exercises at home. Has attended 3 PT sessions. Will need to exercise  consistently at home to see any changes.    Pt will benefit from skilled therapeutic intervention in order to improve on the following deficits Postural dysfunction;Improper body mechanics;Decreased range of motion;Decreased strength;Pain;Decreased endurance;Decreased activity tolerance   Rehab Potential Good   PT Frequency 2x / week   PT Duration 6 weeks   PT Treatment/Interventions Patient/family education;ADLs/Self Care Home Management;Therapeutic exercise;Therapeutic activities;Neuromuscular re-education;Manual techniques;Dry needling;Cryotherapy;Electrical Stimulation;Moist Heat;Ultrasound   PT Next Visit Plan Pt will see Dr. Georgina Snell today and discuss further treatment.    PT Home Exercise Plan HEP    Consulted and Agree with Plan of Care Patient        Problem List Patient Active Problem List   Diagnosis Date Noted  . Muscle spasm of back 06/14/2014  . Chronic low back pain 06/14/2014  . ABSCESS, TOOTH 01/30/2010  . ACUTE NASOPHARYNGITIS 12/18/2009  . GASTROENTERITIS WITHOUT DEHYDRATION 01/31/2009  . HYPERLIPIDEMIA 11/22/2008  . Gout 11/22/2008  . DEPRESSION 11/22/2008  . ESSENTIAL HYPERTENSION, BENIGN 11/22/2008  . OSTEOPENIA 11/22/2008  . ALCOHOL ABUSE, HX OF 11/22/2008  . [place] DISEASE, CERVICAL 11/21/2008  . [place] DISEASE, LUMBAR 11/21/2008    Jerrion Tabbert Nilda Simmer PT, MPH  03/19/2015, 4:17 PM  [facility] [place], [place], [address] Phone: [phone]   Fax:  [phone]  Name: Thomas Noble MRN: [id number] Date of Birth: 1961/02/18    PHYSICAL THERAPY DISCHARGE SUMMARY  Visits from Start of Care: 3  Current functional level related to goals / functional outcomes: Patient reports continued LBP and hip pain. He does not have time nor want to work on [employer]. He requests d/c for PT at this time.   Remaining deficits: Unchanged    Education / Equipment: HEP - patient noncompliant  Plan: Patient  agrees to discharge.  Patient goals were not met. Patient is being discharged due to the patient's request.  ?????     Kacee Sukhu P. Helene Kelp PT, MPH 04/10/2015 9:12 AM

## 2015-03-19 NOTE — Patient Instructions (Addendum)
Thank you for coming in today. Continue PT.  Return as needed.  Follow up with your doctor about your blood pressure.

## 2015-05-31 ENCOUNTER — Ambulatory Visit (INDEPENDENT_AMBULATORY_CARE_PROVIDER_SITE_OTHER): Payer: PRIVATE HEALTH INSURANCE | Admitting: Family Medicine

## 2015-05-31 VITALS — BP 167/99 | HR 98 | Temp 99.5°F

## 2015-05-31 DIAGNOSIS — J069 Acute upper respiratory infection, unspecified: Secondary | ICD-10-CM | POA: Insufficient documentation

## 2015-05-31 DIAGNOSIS — B9789 Other viral agents as the cause of diseases classified elsewhere: Principal | ICD-10-CM

## 2015-05-31 MED ORDER — IPRATROPIUM BROMIDE 0.06 % NA SOLN: 2.0000 | NASAL | Status: DC | PRN

## 2015-05-31 MED ORDER — GUAIFENESIN-CODEINE 100-10 MG/5ML PO SOLN: 5.0000 mL | Freq: Every evening | ORAL | Status: DC | PRN

## 2015-05-31 MED ORDER — PREDNISONE 10 MG PO TABS: 30.0000 mg | ORAL_TABLET | Freq: Every day | ORAL | Status: DC

## 2015-05-31 MED ORDER — AZITHROMYCIN 250 MG PO TABS: 250.0000 mg | ORAL_TABLET | Freq: Every day | ORAL | Status: DC

## 2015-05-31 NOTE — Assessment & Plan Note (Signed)
Symptoms consistent with viral URI. Treat with prednisone Atrovent nasal spray and codeine cough syrup. Use azithromycin antibiotics found better.

## 2015-05-31 NOTE — Progress Notes (Signed)
       Elliot CousinJoseph Muzquiz is a 55 y.o. male who [name] today for cough congestion as body aches headache ear pain fatigue sinus pain and pressure. Symptoms present for about one day. Patient notes subjective fevers and chills. He's tried some over-the-counter medicines which have helped a bit. No vomiting or diarrhea. Patient notes that he forgot to take his blood pressure medicine this morning.   No past medical history on file. No past surgical history on file. Social History  Substance Use Topics  . Smoking status: Never Smoker   . Smokeless tobacco: Not on file  . Alcohol Use: Not on file   family history is not on file.  ROS as above Medications: Current Outpatient Prescriptions  Medication Sig Dispense Refill  . allopurinol (ZYLOPRIM) 300 MG tablet Take 300 mg by mouth daily.    [name]. amLODipine (NORVASC) 10 MG tablet TAKE 1 TABLET (10 MG TOTAL) BY MOUTH DAILY. 30 tablet 2  . azithromycin (ZITHROMAX) 250 MG tablet Take 1 tablet (250 mg total) by mouth daily. Take first 2 tablets together, then 1 every day until finished. 6 tablet 0  . guaiFENesin-codeine 100-10 MG/5ML syrup Take 5 mLs by mouth at bedtime as needed for cough. 120 mL 0  . ibuprofen (ADVIL,MOTRIN) 800 MG tablet Take 800 mg by mouth every 8 (eight) hours as needed.    [name]. ipratropium (ATROVENT) 0.06 % nasal spray Place 2 sprays into both nostrils every 4 (four) hours as needed for rhinitis. 10 mL 6  . methocarbamol (ROBAXIN) 500 MG tablet Take 1 tablet (500 mg total) by mouth every 8 (eight) hours as needed for muscle spasms. 45 tablet 1  . pravastatin (PRAVACHOL) 80 MG tablet TAKE 1 TABLET (80 MG TOTAL) BY MOUTH DAILY. 30 tablet 2  . predniSONE (DELTASONE) 10 MG tablet Take 3 tablets (30 mg total) by mouth daily with breakfast. 15 tablet 0   No current facility-administered medications for this visit.   No Known  Allergies   Exam:  BP 167/99 mmHg  Pulse 98  Temp(Src) 99.5 F (37.5 C) (Oral)  SpO2 97% Gen: Well NAD HEENT: EOMI,  MMM Clear nasal discharge. Normal posterior pharynx. Normal tympanic membranes. No cervical lymphadenopathy. Lungs: Normal work of breathing. CTABL Heart: RRR no MRG Abd: NABS, Soft. Nondistended, Nontender Exts: Brisk capillary refill, warm and well perfused.   No results found for this or any previous visit (from the past 24 hour(s)). No results found.   Please see individual assessment and plan sections.

## 2015-05-31 NOTE — Patient Instructions (Signed)
Thank you for coming in today. Use the nasal spray and take the prednisone.  Use the cough medicine as needed.  Take azithromycin as needed if not better.   Upper Respiratory Infection, Adult Most upper respiratory infections (URIs) are a viral infection of the air passages leading to the lungs. A URI affects the nose, throat, and upper air passages. The most common type of URI is nasopharyngitis and is typically referred to as "the common cold." URIs run their course and usually go away on their own. Most of the time, a URI does not require medical attention, but sometimes a bacterial infection in the upper airways can follow a viral infection. This is called a secondary infection. Sinus and middle ear infections are common types of secondary upper respiratory infections. Bacterial pneumonia can also complicate a URI. A URI can worsen asthma and chronic obstructive pulmonary disease (COPD). Sometimes, these complications can require emergency medical care and may be life threatening.  CAUSES Almost all URIs are caused by viruses. A virus is a type of germ and can spread from one person to another.  RISKS FACTORS You may be at risk for a URI if:   You smoke.   You have chronic heart or lung disease.  You have a weakened defense (immune) system.   You are very young or very old.   You have nasal allergies or asthma.  You work in crowded or poorly ventilated areas.  You work in health care facilities or schools. SIGNS AND SYMPTOMS  Symptoms typically develop 2-3 days after you come in contact with a cold virus. Most viral URIs last 7-10 days. However, viral URIs from the influenza virus (flu virus) can last 14-18 days and are typically more severe. Symptoms may include:   Runny or stuffy (congested) nose.   Sneezing.   Cough.   Sore throat.   Headache.   Fatigue.   Fever.   Loss of appetite.   Pain in your forehead, behind your eyes, and over your cheekbones  (sinus pain).  Muscle aches.  DIAGNOSIS  Your health care provider may diagnose a URI by:  Physical exam.  Tests to check that your symptoms are not due to another condition such as:  Strep throat.  Sinusitis.  Pneumonia.  Asthma. TREATMENT  A URI goes away on its own with time. It cannot be cured with medicines, but medicines may be prescribed or recommended to relieve symptoms. Medicines may help:  Reduce your fever.  Reduce your cough.  Relieve nasal congestion. HOME CARE INSTRUCTIONS   Take medicines only as directed by your health care provider.   Gargle warm saltwater or take cough drops to comfort your throat as directed by your health care provider.  Use a warm mist humidifier or inhale steam from a shower to increase air moisture. This may make it easier to breathe.  Drink enough fluid to keep your urine clear or pale yellow.   Eat soups and other clear broths and maintain good nutrition.   Rest as needed.   Return to work when your temperature has returned to normal or as your health care provider advises. You may need to stay home longer to avoid infecting others. You can also use a face mask and careful hand washing to prevent spread of the virus.  Increase the usage of your inhaler if you have asthma.   Do not use any tobacco products, including cigarettes, chewing tobacco, or electronic cigarettes. If you need help quitting, ask your  health care provider. PREVENTION  The best way to protect yourself from getting a cold is to practice good hygiene.   Avoid oral or hand contact with people with cold symptoms.   Wash your hands often if contact occurs.  There is no clear evidence that vitamin C, vitamin E, echinacea, or exercise reduces the chance of developing a cold. However, it is always recommended to get plenty of rest, exercise, and practice good nutrition.  SEEK MEDICAL CARE IF:   You are getting worse rather than better.   Your  symptoms are not controlled by medicine.   You have chills.  You have worsening shortness of breath.  You have brown or red mucus.  You have yellow or brown nasal discharge.  You have pain in your face, especially when you bend forward.  You have a fever.  You have swollen neck glands.  You have pain while swallowing.  You have white areas in the back of your throat. SEEK IMMEDIATE MEDICAL CARE IF:   You have severe or persistent:  Headache.  Ear pain.  Sinus pain.  Chest pain.  You have chronic lung disease and any of the following:  Wheezing.  Prolonged cough.  Coughing up blood.  A change in your usual mucus.  You have a stiff neck.  You have changes in your:  Vision.  Hearing.  Thinking.  Mood. MAKE SURE YOU:   Understand these instructions.  Will watch your condition.  Will get help right away if you are not doing well or get worse.   This information is not intended to replace advice given to you by your health care provider. Make sure you discuss any questions you have with your health care provider.   Document Released: 08/13/2000 Document Revised: 07/04/2014 Document Reviewed: 05/25/2013 Elsevier Interactive Patient Education [employer].

## 2015-08-24 ENCOUNTER — Telehealth: Payer: Self-pay | Admitting: Physician Assistant

## 2015-08-24 ENCOUNTER — Encounter: Payer: Self-pay | Admitting: Physician Assistant

## 2015-08-24 ENCOUNTER — Other Ambulatory Visit: Payer: Self-pay | Admitting: Physician Assistant

## 2015-08-24 ENCOUNTER — Ambulatory Visit (INDEPENDENT_AMBULATORY_CARE_PROVIDER_SITE_OTHER): Payer: PRIVATE HEALTH INSURANCE | Admitting: Physician Assistant

## 2015-08-24 VITALS — BP 134/76 | HR 81 | Ht 75.0 in | Wt 244.0 lb

## 2015-08-24 DIAGNOSIS — E785 Hyperlipidemia, unspecified: Secondary | ICD-10-CM

## 2015-08-24 DIAGNOSIS — Z131 Encounter for screening for diabetes mellitus: Secondary | ICD-10-CM | POA: Diagnosis not present

## 2015-08-24 DIAGNOSIS — Z0000 Encounter for general adult medical examination without abnormal findings: Secondary | ICD-10-CM

## 2015-08-24 DIAGNOSIS — Z1211 Encounter for screening for malignant neoplasm of colon: Secondary | ICD-10-CM | POA: Diagnosis not present

## 2015-08-24 LAB — CBC WITH DIFFERENTIAL/PLATELET
BASOS PCT: 1 %
Basophils Absolute: 71 cells/uL (ref 0–200)
Eosinophils Absolute: 497 cells/uL (ref 15–500)
Eosinophils Relative: 7 %
HCT: 43.5 % (ref 38.5–50.0)
Hemoglobin: 15.3 g/dL (ref 13.2–17.1)
LYMPHS PCT: 27 %
Lymphs Abs: 1917 cells/uL (ref 850–3900)
MCH: 31.6 pg (ref 27.0–33.0)
MCHC: 35.2 g/dL (ref 32.0–36.0)
MCV: 89.9 fL (ref 80.0–100.0)
MONO ABS: 639 {cells}/uL (ref 200–950)
MONOS PCT: 9 %
MPV: 11.1 fL (ref 7.5–12.5)
NEUTROS ABS: 3976 {cells}/uL (ref 1500–7800)
Neutrophils Relative %: 56 %
PLATELETS: 224 10*3/uL (ref 140–400)
RBC: 4.84 MIL/uL (ref 4.20–5.80)
RDW: 13.5 % (ref 11.0–15.0)
WBC: 7.1 10*3/uL (ref 3.8–10.8)

## 2015-08-24 LAB — COMPLETE METABOLIC PANEL WITH GFR
ALT: 18 U/L (ref 9–46)
AST: 14 U/L (ref 10–35)
Albumin: 4.8 g/dL (ref 3.6–5.1)
Alkaline Phosphatase: 91 U/L (ref 40–115)
BUN: 14 mg/dL (ref 7–25)
CALCIUM: 9.6 mg/dL (ref 8.6–10.3)
CHLORIDE: 105 mmol/L (ref 98–110)
CO2: 26 mmol/L (ref 20–31)
CREATININE: 0.93 mg/dL (ref 0.70–1.33)
GFR, Est African American: >89 mL/min (ref >=60)
GFR, Est Non African American: >89 mL/min (ref >=60)
Glucose, Bld: 106 mg/dL — ABNORMAL HIGH (ref 65–99)
POTASSIUM: 5.1 mmol/L (ref 3.5–5.3)
Sodium: 142 mmol/L (ref 135–146)
Total Bilirubin: 0.4 mg/dL (ref 0.2–1.2)
Total Protein: 7.2 g/dL (ref 6.1–8.1)

## 2015-08-24 LAB — LIPID PANEL
CHOL/HDL RATIO: 3.8 ratio (ref <=5.0)
Cholesterol: 176 mg/dL (ref 125–200)
HDL: 46 mg/dL (ref >=40)
LDL CALC: 112 mg/dL (ref <130)
Triglycerides: 88 mg/dL (ref <150)
VLDL: 18 mg/dL (ref <30)

## 2015-08-24 NOTE — Telephone Encounter (Signed)
Patient signed medical records release form on 08/24/15 for Jade to send over Annual Physical Results and office notes to [facility]) to [facility] (Fax: [phone])

## 2015-08-24 NOTE — Progress Notes (Signed)
Subjective:    Patient ID: Thomas Noble, male    DOB: 09/27/1960, 55 y.o.   MRN: [id number]  HPI Patient is a 55 year old male who presents to the clinic for complete physical. He denies any concerns or complaints. He does take Pravachol for hyperlipidemia and Norvasc for hypertension.   .. Active Ambulatory Problems    Diagnosis Date Noted  . HYPERLIPIDEMIA 11/22/2008  . Gout 11/22/2008  . DEPRESSION 11/22/2008  . ESSENTIAL HYPERTENSION, BENIGN 11/22/2008  . ACUTE NASOPHARYNGITIS 12/18/2009  . ABSCESS, TOOTH 01/30/2010  . GASTROENTERITIS WITHOUT DEHYDRATION 01/31/2009  . [place] DISEASE, CERVICAL 11/21/2008  . [place] DISEASE, LUMBAR 11/21/2008  . OSTEOPENIA 11/22/2008  . ALCOHOL ABUSE, HX OF 11/22/2008  . Muscle spasm of back 06/14/2014  . Chronic low back pain 06/14/2014  . Viral URI with cough 05/31/2015   Resolved Ambulatory Problems    Diagnosis Date Noted  . DDD (degenerative disc disease), lumbar 06/14/2014  . Left-sided low back pain without sciatica 02/12/2015   No Additional Past Medical History   .Marland KitchenNo family history on file. .. Social History   Social History  . Marital Status: Married    Spouse Name: N/A  . Number of Children: N/A  . Years of Education: N/A   Occupational History  . Not on file.   Social History Main Topics  . Smoking status: Never Smoker   . Smokeless tobacco: Not on file  . Alcohol Use: Not on file  . Drug Use: Not on file  . Sexual Activity: Not on file   Other Topics Concern  . Not on file   Social History Narrative     Review of Systems  All other systems reviewed and are negative.      Objective:   Physical Exam BP 134/76 mmHg  Pulse 81  Ht _0  (1.905 m)  Wt 244 lb (110.678 kg)  BMI 30.50 kg/m2  General Appearance:    Alert, cooperative, no distress, appears stated age  Head:    Normocephalic, without obvious abnormality, atraumatic  Eyes:    PERRL, conjunctiva/corneas clear, EOM's intact, fundi    benign,  both eyes       Ears:    Normal TM's and external ear canals, both ears  Nose:   Nares normal, septum midline, mucosa normal, no drainage    or sinus tenderness  Throat:   Lips, mucosa, and tongue normal; teeth and gums normal  Neck:   Supple, symmetrical, trachea midline, no adenopathy;       thyroid:  No enlargement/tenderness/nodules; no carotid   bruit or JVD  Back:     Symmetric, no curvature, ROM normal, no CVA tenderness  Lungs:     Clear to auscultation bilaterally, respirations unlabored  Chest wall:    No tenderness or deformity  Heart:    Regular rate and rhythm, S1 and S2 normal, no murmur, rub   or gallop  Abdomen:     Soft, non-tender, bowel sounds active all four quadrants,    no masses, no organomegaly  Genitalia:  Not done.   Rectal:  NOt done.   Extremities:   Extremities normal, atraumatic, no cyanosis or edema  Pulses:   2+ and symmetric all extremities  Skin:   Skin color, texture, turgor normal, no rashes or lesions  Lymph nodes:   Cervical, supraclavicular, and axillary nodes normal  Neurologic:   CNII-XII intact. Normal strength, sensation and reflexes      throughout  Assessment & Plan:  CPE- declines Hep C and HIV screening. Lipid, cmp, psa and cbc ordered. AUA was 1. Pt declined colonoscopy but agreed to do cologuard. He signed and will send kit to home. Encouraged patient to start 46m of ASA daily.   [place]

## 2015-08-24 NOTE — Patient Instructions (Signed)
Keeping you healthy  Get these tests  Blood pressure- Have your blood pressure checked once a year by your healthcare provider.  Normal blood pressure is 120/80.  Weight- Have your body mass index (BMI) calculated to screen for obesity.  BMI is a measure of body fat based on height and weight. You can also calculate your own BMI at https://www.west-esparza.com/www.nhlbisupport.com/bmi/.  Cholesterol- Have your cholesterol checked regularly starting at age 56, sooner may be necessary if you have diabetes, high blood pressure, if a family member developed heart diseases at an early age or if you smoke.   Chlamydia, HIV, and other sexual transmitted disease- Get screened each year until the age of 56 then within three months of each new sexual partner.  Diabetes- Have your blood sugar checked regularly if you have high blood pressure, high cholesterol, a family history of diabetes or if you are overweight.  Get these vaccines  Flu shot- Every fall.  Take these steps  Don't smoke- If you do smoke, ask your healthcare provider about quitting. For tips on how to quit, go to www.smokefree.gov or call 1-800-QUIT-NOW.  Be physically active- Exercise 5 days a week for at least 30 minutes.  If you are not already physically active start slow and gradually work up to 30 minutes of moderate physical activity.  Examples of moderate activity include walking briskly, mowing the yard, dancing, swimming bicycling, etc.  Eat a healthy diet- Eat a variety of healthy foods such as fruits, vegetables, low fat milk, low fat cheese, yogurt, lean meats, poultry, fish, beans, tofu, etc.  For more information on healthy eating, go to www.thenutritionsource.org  Drink alcohol in moderation- Limit alcohol intake two drinks or less a day.  Never drink and drive.  Dentist- Brush and floss teeth twice daily; visit your dentis twice a year.  Depression-Your emotional health is as important as your physical health.  If you're feeling down, losing  interest in things you normally enjoy please talk with your healthcare provider.  Gun Safety- If you keep a gun in your home, keep it unloaded and with the safety lock on.  Bullets should be stored separately.  Helmet use- Always wear a helmet when riding a motorcycle, bicycle, rollerblading or skateboarding.  Safe sex- If you may be exposed to a sexually transmitted infection, use a condom  Seat belts- Seat bels can save your life; always wear one.  Smoke/Carbon Monoxide detectors- These detectors need to be installed on the appropriate level of your home.  Replace batteries at least once a year.  Skin Cancer- When out in the sun, cover up and use sunscreen SPF 15 or higher.  VioleTetanus shot- Every 10 years.  Menactra- Single dose; prevents meningitis.nce- If anyone is threatening or hurting you, please tell your healthcare provider.

## 2015-08-25 LAB — PSA, TOTAL AND FREE
PSA FREE: 0.26 ng/mL
PSA, Free Pct: 15 % — ABNORMAL LOW (ref >25)
PSA: 1.72 ng/mL (ref <=4.00)

## 2015-08-26 ENCOUNTER — Encounter: Payer: Self-pay | Admitting: Physician Assistant

## 2015-08-26 DIAGNOSIS — R7301 Impaired fasting glucose: Secondary | ICD-10-CM | POA: Insufficient documentation

## 2015-08-27 ENCOUNTER — Encounter: Payer: Self-pay | Admitting: Physician Assistant

## 2015-08-27 LAB — HEMOGLOBIN A1C
Hgb A1c MFr Bld: 5.6 % (ref <5.7)
Mean Plasma Glucose: 114 mg/dL

## 2016-01-02 ENCOUNTER — Encounter: Payer: Self-pay | Admitting: Family Medicine

## 2016-01-02 ENCOUNTER — Ambulatory Visit (INDEPENDENT_AMBULATORY_CARE_PROVIDER_SITE_OTHER): Payer: PRIVATE HEALTH INSURANCE | Admitting: Family Medicine

## 2016-01-02 VITALS — BP 143/79 | HR 89 | Wt 251.0 lb

## 2016-01-02 DIAGNOSIS — J0101 Acute recurrent maxillary sinusitis: Secondary | ICD-10-CM | POA: Diagnosis not present

## 2016-01-02 MED ORDER — IPRATROPIUM BROMIDE 0.06 % NA SOLN: 2.0000 | NASAL | 6 refills | Status: DC | PRN

## 2016-01-02 MED ORDER — PREDNISONE 10 MG PO TABS: 30.0000 mg | ORAL_TABLET | Freq: Every day | ORAL | 0 refills | Status: DC

## 2016-01-02 MED ORDER — CEFDINIR 300 MG PO CAPS: 300.0000 mg | ORAL_CAPSULE | Freq: Two times a day (BID) | ORAL | 0 refills | Status: DC

## 2016-01-02 NOTE — Progress Notes (Signed)
       Thomas Noble is a 55 y.o. male who [name] Sports Medicine today for sinus pain and pressure nosebleed horse voice and congestion. Symptoms present for a few days. Symptoms are consistent with previous episodes of sinusitis. In the past and well with prednisone and Atrovent nasal spray and antibiotic. He denies any vomiting or diarrhea. Symptoms worsened today.  Patient has tried some over-the-counter medications which have not helped much.   Past Medical History:  Diagnosis Date  . Essential hypertension, benign 11/22/2008   Qualifier: Diagnosis of  By: Thomas Noble    . Gout 11/22/2008   Qualifier: Diagnosis of  By: Thomas Noble    . HLD (hyperlipidemia) 11/22/2008   Qualifier: Diagnosis of  By: Thomas Noble     No past surgical history on file. Social History  Substance Use Topics  . Smoking status: Never Smoker  . Smokeless tobacco: Not on file  . Alcohol use Not on file   family history is not on file.  ROS as above:  Medications: Current Outpatient Prescriptions  Medication Sig Dispense Refill  . allopurinol (ZYLOPRIM) 300 MG tablet Take 300 mg by mouth daily.    [name]. amLODipine (NORVASC) 10 MG tablet TAKE 1 TABLET (10 MG TOTAL) BY MOUTH DAILY. 30 tablet 2  . methocarbamol (ROBAXIN) 500 MG tablet Take 1 tablet (500 mg total) by mouth every 8 (eight) hours as needed for muscle spasms. 45 tablet 1  . pravastatin (PRAVACHOL) 80 MG tablet TAKE 1 TABLET (80 MG TOTAL) BY MOUTH DAILY. 30 tablet 2  . cefdinir (OMNICEF) 300 MG capsule Take 1 capsule (300 mg total) by mouth 2 (two) times daily. 14 capsule 0  . ipratropium (ATROVENT) 0.06 % nasal spray Place 2 sprays into both nostrils every 4 (four) hours as needed for rhinitis. 10 mL 6  . predniSONE (DELTASONE) 10 MG tablet Take 3 tablets (30 mg total) by mouth daily with breakfast. 15 tablet 0   No current  facility-administered medications for this visit.    No Known Allergies  Health Maintenance Health Maintenance  Topic Date Due  . COLONOSCOPY  04/28/2009  . INFLUENZA VACCINE  10/02/2015  . Hepatitis C Screening  08/23/2016 (Originally 1960/06/06)  . HIV Screening  08/23/2016 (Originally 04/28/1974)  . TETANUS/TDAP  03/03/2022     Exam:  BP (!) 143/79   Pulse 89   Wt 251 lb (113.9 kg)   BMI 31.37 kg/m  Gen: Well NAD HEENT: EOMI,  MMM Tender to palpation bilateral maxillary sinuses. Normal tympanic membranes bilaterally. Normal posterior pharynx Lungs: Normal work of breathing. CTABL Heart: RRR no MRG Abd: NABS, Soft. Nondistended, Nontender Exts: Brisk capillary refill, warm and well perfused.    No results found for this or any previous visit (from the past 72 hour(s)). No results found.    Assessment and Plan: 55 y.o. male with viral sinusitis likely. Treat empirically with prednisone and Atrovent nasal spray. Use Omnicef antibiotics if not improved.   No orders of the defined types were placed in this encounter.   Discussed warning signs or symptoms. Please see discharge instructions. Patient expresses understanding.

## 2016-01-02 NOTE — Patient Instructions (Signed)
Thank you for coming in today. Call or go to the emergency room if you get worse, have trouble breathing, have chest pains, or palpitations.   Sinusitis, Adult Sinusitis is redness, soreness, and inflammation of the paranasal sinuses. Paranasal sinuses are air pockets within the bones of your face. They are located beneath your eyes, in the middle of your forehead, and above your eyes. In healthy paranasal sinuses, mucus is able to drain out, and air is able to circulate through them by way of your nose. However, when your paranasal sinuses are inflamed, mucus and air can become trapped. This can allow bacteria and other germs to grow and cause infection. Sinusitis can develop quickly and last only a short time (acute) or continue over a long period (chronic). Sinusitis that lasts for more than 12 weeks is considered chronic. CAUSES Causes of sinusitis include:  Allergies.  Structural abnormalities, such as displacement of the cartilage that separates your nostrils (deviated septum), which can decrease the air flow through your nose and sinuses and affect sinus drainage.  Functional abnormalities, such as when the small hairs (cilia) that line your sinuses and help remove mucus do not work properly or are not present. SIGNS AND SYMPTOMS Symptoms of acute and chronic sinusitis are the same. The primary symptoms are pain and pressure around the affected sinuses. Other symptoms include:  Upper toothache.  Earache.  Headache.  Bad breath.  Decreased sense of smell and taste.  A cough, which worsens when you are lying flat.  Fatigue.  Fever.  Thick drainage from your nose, which often is green and may contain pus (purulent).  Swelling and warmth over the affected sinuses. DIAGNOSIS Your health care provider will perform a physical exam. During your exam, your health care provider may perform any of the following to help determine if you have acute sinusitis or chronic  sinusitis:  Look in your nose for signs of abnormal growths in your nostrils (nasal polyps).  Tap over the affected sinus to check for signs of infection.  View the inside of your sinuses using an imaging device that has a light attached (endoscope). If your health care provider suspects that you have chronic sinusitis, one or more of the following tests may be recommended:  Allergy tests.  Nasal culture. A sample of mucus is taken from your nose, sent to a lab, and screened for bacteria.  Nasal cytology. A sample of mucus is taken from your nose and examined by your health care provider to determine if your sinusitis is related to an allergy. TREATMENT Most cases of acute sinusitis are related to a viral infection and will resolve on their own within 10 days. Sometimes, medicines are prescribed to help relieve symptoms of both acute and chronic sinusitis. These may include pain medicines, decongestants, nasal steroid sprays, or saline sprays. However, for sinusitis related to a bacterial infection, your health care provider will prescribe antibiotic medicines. These are medicines that will help kill the bacteria causing the infection. Rarely, sinusitis is caused by a fungal infection. In these cases, your health care provider will prescribe antifungal medicine. For some cases of chronic sinusitis, surgery is needed. Generally, these are cases in which sinusitis recurs more than 3 times per year, despite other treatments. HOME CARE INSTRUCTIONS  Drink plenty of water. Water helps thin the mucus so your sinuses can drain more easily.  Use a humidifier.  Inhale steam 3-4 times a day (for example, sit in the bathroom with the shower running).    Apply a warm, moist washcloth to your face 3-4 times a day, or as directed by your health care provider.  Use saline nasal sprays to help moisten and clean your sinuses.  Take medicines only as directed by your health care provider.  If you were  prescribed either an antibiotic or antifungal medicine, finish it all even if you start to feel better. SEEK IMMEDIATE MEDICAL CARE IF:  You have increasing pain or severe headaches.  You have nausea, vomiting, or drowsiness.  You have swelling around your face.  You have vision problems.  You have a stiff neck.  You have difficulty breathing.   This information is not intended to replace advice given to you by your health care provider. Make sure you discuss any questions you have with your health care provider.   Document Released: 02/17/2005 Document Revised: 03/10/2014 Document Reviewed: 03/04/2011 Elsevier Interactive Patient Education 2016 Elsevier Inc.  

## 2016-01-14 ENCOUNTER — Ambulatory Visit (INDEPENDENT_AMBULATORY_CARE_PROVIDER_SITE_OTHER): Payer: PRIVATE HEALTH INSURANCE | Admitting: Family Medicine

## 2016-01-14 ENCOUNTER — Encounter: Payer: Self-pay | Admitting: Family Medicine

## 2016-01-14 VITALS — BP 153/92 | HR 87 | Wt 252.0 lb

## 2016-01-14 DIAGNOSIS — S86112A Strain of other muscle(s) and tendon(s) of posterior muscle group at lower leg level, left leg, initial encounter: Secondary | ICD-10-CM | POA: Diagnosis not present

## 2016-01-14 NOTE — Progress Notes (Signed)
   Thomas Noble is a 55 y.o. male who [facility] today for left leg bruising. Patient stepped in a hole injuring the medial aspect of his gastrocnemius about a week ago. He didn't have much pain at the time. Yesterday he noted bruising along the medial aspect of his ankle. He continues to feel well with only mild pain with walking. He's able to continue working. He denies any significant leg swelling chest pain or shortness of breath.   Past Medical History:  Diagnosis Date  . Essential hypertension, benign 11/22/2008   Qualifier: Diagnosis of  By: Thomos LemonsBowen DO, Karen    . Gout 11/22/2008   Qualifier: Diagnosis of  By: Thomos LemonsBowen DO, Karen    . HLD (hyperlipidemia) 11/22/2008   Qualifier: Diagnosis of  By: Cathey EndowBowen DO, Karen     No past surgical history on file. Social History  Substance Use Topics  . Smoking status: Never Smoker  . Smokeless tobacco: Not on file  . Alcohol use Not on file     ROS:  As above   Medications: Current Outpatient Prescriptions  Medication Sig Dispense Refill  . allopurinol (ZYLOPRIM) 300 MG tablet Take 300 mg by mouth daily.    [name]. amLODipine (NORVASC) 10 MG tablet TAKE 1 TABLET (10 MG TOTAL) BY MOUTH DAILY. 30 tablet 2  . ipratropium (ATROVENT) 0.06 % nasal spray Place 2 sprays into both nostrils every 4 (four) hours as needed for rhinitis. 10 mL 6  . methocarbamol (ROBAXIN) 500 MG tablet Take 1 tablet (500 mg total) by mouth every 8 (eight) hours as needed for muscle spasms. 45 tablet 1  . pravastatin (PRAVACHOL) 80 MG tablet TAKE 1 TABLET (80 MG TOTAL) BY MOUTH DAILY. 30 tablet 2   No current facility-administered medications for this visit.    No Known Allergies   Exam:  BP (!) 153/92   Pulse 87   Wt 252 lb (114.3 kg)   BMI 31.50 kg/m  General: Well Developed, well nourished, and in no acute distress.  Neuro/Psych: Alert and oriented x3, extra-ocular muscles intact, able to move all 4 extremities,  sensation grossly intact. Skin: Warm and dry, no rashes noted.  Respiratory: Not using accessory muscles, speaking in full sentences, trachea midline.  Cardiovascular: Pulses palpable, no extremity edema. Abdomen: Does not appear distended. MSK: Left leg: Slightly swollen and firm at the musculotendinous junction of the medial head of the gastrocnemius. Ecchymosis is present at the medial ankle near the tarsal tunnel without tenderness. Foot motion and strength is intact. Pulses Are refill and sensation are intact distally. No edema bilaterally. No palpable cords.    No results found for this or any previous visit (from the past 48 hour(s)). No results found.    Assessment and Plan: 55 y.o. male with medial head gastrocnemius muscle injury. Doubtful for DVT. Ecchymosis is due to resolving hematoma. Plan to use sleeve home exercise program and recheck in about 4 weeks or sooner if needed. Discussed warning signs and symptoms.    No orders of the defined types were placed in this encounter.   Discussed warning signs or symptoms. Please see discharge instructions. Patient expresses understanding.

## 2016-01-14 NOTE — Patient Instructions (Signed)
Thank you for coming in today. Use a compression calf sleeve.  Return for recheck in 1 month if not better.    Medial Head Gastrocnemius Tear With Rehab Medial head gastrocnemius tear, also called tennis leg, is a tear (strain) in a muscle or tendon of the inner portion (medial head) of one of the calf muscles (gastrocnemius). The inner portion of the calf muscle attaches to the thigh bone (femur) and is responsible for bending the knee and straightening the foot (standing "on tiptoe"). Strains are classified into three categories. Grade 1 strains cause pain, but the tendon is not lengthened. Grade 2 strains include a lengthened ligament, due to the ligament being stretched or partially ruptured. With grade 2 strains there is still function, although function may be decreased. Grade 3 strains involve a complete tear of the tendon or muscle, and function is usually impaired. SYMPTOMS   Sudden "pop" or tear felt at the time of injury.  Pain, tenderness, swelling, warmth, or redness over the middle inner calf.  Pain and weakness with ankle motion, especially flexing the ankle against resistance, as well as pain with lifting up the foot (extending the ankle).  Bruising (contusion) of the calf, heel, and sometimes the foot within 48 hours of injury.  Muscle spasm in the calf. CAUSES  Muscle and ligament strains occur when a force is placed on the muscle or ligament that is greater than it can handle. Common causes of injury include:  Direct hit (trauma) to the calf.  Sudden forceful pushing off or landing on the foot (jumping, landing, serving a tennis ball, lunging). RISK INCREASES WITH:  Sports that require sudden, explosive calf muscle contraction, such as those involving jumping (basketball), hill running, quick starts (running), or lunging (racquetball, tennis).  Contact sports (football, soccer, hockey).  Poor strength and flexibility.  Previous lower limb injury. PREVENTION  Warm  up and stretch properly before activity.  Allow for adequate recovery between workouts.  Maintain physical fitness:  Strength, flexibility, and endurance.  Cardiovascular fitness.  Learn and use proper exercise technique.  Complete rehabilitation after lower limb injury, before returning to competition or practice. PROGNOSIS  If treated properly, tennis leg usually heals within 6 weeks of nonsurgical treatment.  RELATED COMPLICATIONS   Longer healing time, if not properly treated or if not given enough time to heal.  Recurring symptoms and injury, if activity is resumed too soon, with overuse, with a direct blow, or with poor technique.  If untreated, may progress to a complete tear (rare) or other injury, due to limping and favoring of the injured leg.  Persistent limping, due to scarring and shortening of the calf muscles, as a result of inadequate rehabilitation.  Prolonged disability. TREATMENT  Treatment first involves the use of ice and medication to help reduce pain and inflammation. The use of strengthening and stretching exercises may help reduce pain with activity. These exercises may be performed at home or with a therapist. For severe injuries, referral to a therapist may be needed for further evaluation and treatment. Your caregiver may advise that you wear a brace to help healing. Sometimes, crutches are needed until you can walk without limping. Rarely, surgery is needed.  MEDICATION   If pain medicine is needed, nonsteroidal anti-inflammatory medicines (aspirin and ibuprofen), or other minor pain relievers (acetaminophen), are often advised.  Do not take pain medicine for 7 days before surgery.  Prescription pain relievers may be given, if your caregiver thinks they are needed. Use only as  directed and only as much as you need. HEAT AND COLD  Cold treatment (icing) should be applied for 10 to 15 minutes every 2 to 3 hours for inflammation and pain, and immediately  after activity that aggravates your symptoms. Use ice packs or an ice massage.  Heat treatment may be used before performing stretching and strengthening activities prescribed by your caregiver, physical therapist, or athletic trainer. Use a heat pack or a warm water soak. SEEK MEDICAL CARE IF:   Symptoms get worse or do not improve in 2 weeks, despite treatment.  Numbness or tingling develops.  New, unexplained symptoms develop. (Drugs used in treatment may produce side effects.) EXERCISES  RANGE OF MOTION (ROM) AND STRETCHING EXERCISES - Medial Head Gastrocnemius Tear (Tennis Leg) These exercises may help you when beginning to rehabilitate your injury. Your symptoms may resolve with or without further involvement from your physician, physical therapist, or athletic trainer. While completing these exercises, remember:   Restoring tissue flexibility helps normal motion to return to the joints. This allows healthier, less painful movement and activity.  An effective stretch should be held for at least 30 seconds.  A stretch should never be painful. You should only feel a gentle lengthening or release in the stretched tissue. STRETCH - Gastrocsoleus  Sit with your right / left leg extended. Holding onto both ends of a belt or towel, loop it around the ball of your foot.  Keeping your right / left ankle and foot relaxed and your knee straight, pull your foot and ankle toward you using the belt.  You should feel a gentle stretch behind your calf or knee. Hold this position for __________ seconds. Repeat __________ times. Complete this stretch __________ times per day.  RANGE OF MOTION - Ankle Dorsiflexion, Active Assisted   Remove your shoes and sit on a chair, preferably not on a carpeted surface.  Place your right / left foot directly under the knee. Extend your opposite leg for support.  Keeping your heel down, slide your right / left foot back toward the chair, until you feel a  stretch at your ankle or calf. If you do not feel a stretch, slide your bottom forward to the edge of the chair, while still keeping your heel down.  Hold this stretch for __________ seconds. Repeat __________ times. Complete this stretch __________ times per day.  STRETCH - Gastroc, Standing   Place your hands on a wall.  Extend your right / left leg behind you, keeping the front knee somewhat bent.  Slightly point your toes inward on your back foot.  Keeping your right / left heel on the floor and your knee straight, shift your weight toward the wall, not allowing your back to arch.  You should feel a gentle stretch in the right / left calf. Hold this position for __________ seconds. Repeat __________ times. Complete this stretch __________ times per day. STRETCH - [place]us, Standing   Place your hands on a wall.  Extend your right / left leg behind you, keeping the other knee somewhat bent.  Point your toes of your back foot slightly inward.  Keep your right / left heel on the floor, bend your back knee, and slightly shift your weight over the back leg so that you feel a gentle stretch deep in your back calf.  Hold this position for __________ seconds. Repeat __________ times. Complete this stretch __________ times per day. STRETCH - Gastrocsoleus, Standing Note: This exercise can place a lot of stress  on your foot and ankle. Please complete this exercise only if specifically instructed by your caregiver.   Place the ball of your right / left foot on a step, keeping your other foot firmly on the same step.  Hold on to the wall or a rail for balance.  Slowly lift your other foot, allowing your body weight to press your heel down over the edge of the step.  You should feel a stretch in your right / left calf.  Hold this position for __________ seconds.  Repeat this exercise with a slight bend in your right / left knee. Repeat __________ times. Complete this stretch __________  times per day.  STRENGTHENING EXERCISES - Medial Head Gastrocnemius Tear (Tennis Leg) These exercises may help you when beginning to rehabilitate your injury. They may resolve your symptoms with or without further involvement from your physician, physical therapist, or athletic trainer. While completing these exercises, remember:   Muscles can gain both the endurance and the strength needed for everyday activities through controlled exercises.  Complete these exercises as instructed by your physician, physical therapist, or athletic trainer. Increase the resistance and repetitions only as guided by your caregiver. STRENGTH - Plantar-flexors  Sit with your right / left leg extended. Holding onto both ends of a rubber exercise band or tubing, loop it around the ball of your foot. Keep a slight tension in the band.  Slowly push your toes away from you, pointing them downward.  Hold this position for __________ seconds. Return slowly, controlling the tension in the band. Repeat __________ times. Complete this exercise __________ times per day.  STRENGTH - Plantar-flexors  Stand with your feet shoulder width apart. Steady yourself with a wall or table, using as little support as needed.  Keeping your weight evenly spread over the width of your feet, rise up on your toes.*  Hold this position for __________ seconds. Repeat __________ times. Complete this exercise __________ times per day.  *If this is too easy, shift your weight toward your right / left leg until you feel challenged. Ultimately, you may be asked to do this exercise while standing on your right / left foot only. STRENGTH - Plantar-flexors, Eccentric Note: This exercise can place a lot of stress on your foot and ankle. Please complete this exercise only if specifically instructed by your caregiver.   Place the balls of your feet on a step. With your hands, use only enough support from a wall or rail to keep your balance.  Keep  your knees straight and rise up on your toes.  Slowly shift your weight entirely to your right / left toes and pick up your opposite foot. Gently and with controlled movement, lower your weight through your right / left foot so that your heel drops below the level of the step. You will feel a slight stretch in the back of your right / left calf.  Use the healthy leg to help rise up onto the balls of both feet, then lower weight only onto the right / left leg again. Build up to 15 repetitions. Then progress to 3 sets of 15 repetitions.*  After completing the above exercise, complete the same exercise with a slight knee bend (about 30 degrees). Again, build up to 15 repetitions. Then progress to 3 sets of 15 repetitions.* Perform this exercise __________ times per day.  *When you easily complete 3 sets of 15, your physician, physical therapist, or athletic trainer may advise you to add resistance, by wearing  a backpack filled with additional weight.   This information is not intended to replace advice given to you by your health care provider. Make sure you discuss any questions you have with your health care provider.   Document Released: 02/17/2005 Document Revised: 03/10/2014 Document Reviewed: 06/01/2008 Elsevier Interactive Patient Education [employer]2016 Elsevier Inc.

## 2016-02-20 ENCOUNTER — Encounter: Payer: Self-pay | Admitting: Family Medicine

## 2016-02-20 MED ORDER — AMOXICILLIN 500 MG PO CAPS: 500.0000 mg | ORAL_CAPSULE | Freq: Three times a day (TID) | ORAL | 0 refills | Status: DC

## 2016-02-21 ENCOUNTER — Encounter: Payer: Self-pay | Admitting: Family Medicine

## 2016-06-19 LAB — LIPID PANEL
CHOLESTEROL: 193 mg/dL (ref 0–200)
HDL: 49 mg/dL (ref 35–70)
LDL Cholesterol: 119 mg/dL
TRIGLYCERIDES: 127 mg/dL (ref 40–160)

## 2016-06-19 LAB — HEMOGLOBIN A1C: Hemoglobin A1C: 5.1

## 2016-06-19 LAB — BASIC METABOLIC PANEL: Glucose: 103 mg/dL

## 2016-06-20 ENCOUNTER — Ambulatory Visit (INDEPENDENT_AMBULATORY_CARE_PROVIDER_SITE_OTHER): Payer: PRIVATE HEALTH INSURANCE | Admitting: Physician Assistant

## 2016-06-20 ENCOUNTER — Encounter: Payer: PRIVATE HEALTH INSURANCE | Admitting: Family Medicine

## 2016-06-20 ENCOUNTER — Encounter: Payer: Self-pay | Admitting: Physician Assistant

## 2016-06-20 VITALS — BP 137/80 | HR 84 | Ht 75.0 in | Wt 248.0 lb

## 2016-06-20 DIAGNOSIS — E7800 Pure hypercholesterolemia, unspecified: Secondary | ICD-10-CM

## 2016-06-20 DIAGNOSIS — I10 Essential (primary) hypertension: Secondary | ICD-10-CM | POA: Diagnosis not present

## 2016-06-20 DIAGNOSIS — Z0000 Encounter for general adult medical examination without abnormal findings: Secondary | ICD-10-CM

## 2016-06-20 DIAGNOSIS — Z131 Encounter for screening for diabetes mellitus: Secondary | ICD-10-CM | POA: Diagnosis not present

## 2016-06-20 NOTE — Progress Notes (Signed)
Subjective:    Patient ID: Thomas Noble, male    DOB: Jul 15, 1960, 56 y.o.   MRN: [id number]  HPI  Pt is a 56 yo male who presents to the clinic for CPE.   .. Active Ambulatory Problems    Diagnosis Date Noted  . Gout 11/22/2008  . DEPRESSION 11/22/2008  . ESSENTIAL HYPERTENSION, BENIGN 11/22/2008  . ABSCESS, TOOTH 01/30/2010  . GASTROENTERITIS WITHOUT DEHYDRATION 01/31/2009  . [place] DISEASE, CERVICAL 11/21/2008  . [place] DISEASE, LUMBAR 11/21/2008  . OSTEOPENIA 11/22/2008  . ALCOHOL ABUSE, HX OF 11/22/2008  . Chronic low back pain 06/14/2014  . Elevated fasting glucose 08/26/2015  . Pure hypercholesterolemia 06/20/2016   Resolved Ambulatory Problems    Diagnosis Date Noted  . HLD (hyperlipidemia) 11/22/2008  . ACUTE NASOPHARYNGITIS 12/18/2009  . Muscle spasm of back 06/14/2014  . DDD (degenerative disc disease), lumbar 06/14/2014  . Left-sided low back pain without sciatica 02/12/2015  . Viral URI with cough 05/31/2015   Past Medical History:  Diagnosis Date  . Essential hypertension, benign 11/22/2008  . Gout 11/22/2008  . HLD (hyperlipidemia) 11/22/2008   .[name] Social History   Social History  . Marital status: Married    Spouse name: N/A  . Number of children: N/A  . Years of education: N/A   Occupational History  . Not on file.   Social History Main Topics  . Smoking status: Never Smoker  . Smokeless tobacco: Never Used  . Alcohol use Not on file  . Drug use: Unknown  . Sexual activity: Not on file   Other Topics Concern  . Not on file   Social History Narrative  . No narrative on file   [facility] No concerns or complaints today.    Review of Systems  All other systems reviewed and are negative.      Objective:   Physical Exam BP 137/80   Pulse 84   Ht 6' 3"  (1.905 m)   Wt 248 lb (112.5 kg)   BMI 31.00 kg/m   General Appearance:    Alert, cooperative, no distress, appears stated age  Head:    Normocephalic, without  obvious abnormality, atraumatic  Eyes:    PERRL, conjunctiva/corneas clear, EOM's intact, fundi    benign, both eyes       Ears:    Normal TM's and external ear canals, both ears  Nose:   Nares normal, septum midline, mucosa normal, no drainage    or sinus tenderness  Throat:   Lips, mucosa, and tongue normal; teeth and gums normal  Neck:   Supple, symmetrical, trachea midline, no adenopathy;       thyroid:  No enlargement/tenderness/nodules; no carotid   bruit or JVD  Back:     Symmetric, no curvature, ROM normal, no CVA tenderness  Lungs:     Clear to auscultation bilaterally, respirations unlabored  Chest wall:    No tenderness or deformity  Heart:    Regular rate and rhythm, S1 and S2 normal, no murmur, rub   or gallop  Abdomen:     Soft, non-tender, bowel sounds active all four quadrants,    no masses, no organomegaly  Genitalia:  Pt declined.   Rectal:  Pt declined.   Extremities:   Extremities normal, atraumatic, no cyanosis or edema  Pulses:   2+ and symmetric all extremities  Skin:   Skin color, texture, turgor normal, no rashes or lesions  Lymph nodes:   Cervical, supraclavicular, and axillary  nodes normal  Neurologic:   CNII-XII intact. Normal strength, sensation and reflexes      throughout         Assessment & Plan:   .Marland KitchenGraeden was seen today for annual exam.  Diagnoses and all orders for this visit:  Routine physical examination -     COMPLETE METABOLIC PANEL WITH GFR -     Lipid panel  Essential hypertension, benign  Pure hypercholesterolemia -     Lipid panel  Screening for diabetes mellitus -     COMPLETE METABOLIC PANEL WITH GFR   .[name] Depression screen [facility] 2/9 06/20/2016  Decreased Interest 0  Down, Depressed, Hopeless 0  PHQ - 2 Score 0    AUA-2. Pt declined DRE and PSA. Pt aware of risk vs benefits.  Labs ordered.  Pt agrees to do cologuard. He did not do last time kit was received. Aware of risk.   meds are refilled through [place].

## 2016-06-20 NOTE — Patient Instructions (Signed)

## 2016-06-21 LAB — COMPLETE METABOLIC PANEL WITH GFR
ALK PHOS: 96 U/L (ref 40–115)
ALT: 23 U/L (ref 9–46)
AST: 17 U/L (ref 10–35)
Albumin: 4.9 g/dL (ref 3.6–5.1)
BILIRUBIN TOTAL: 0.5 mg/dL (ref 0.2–1.2)
BUN: 11 mg/dL (ref 7–25)
CALCIUM: 10.5 mg/dL — AB (ref 8.6–10.3)
CHLORIDE: 103 mmol/L (ref 98–110)
CO2: 25 mmol/L (ref 20–31)
Creat: 0.93 mg/dL (ref 0.70–1.33)
GFR, Est African American: >89 mL/min (ref >=60)
GFR, Est Non African American: >89 mL/min (ref >=60)
GLUCOSE: 98 mg/dL (ref 65–99)
Potassium: 5.1 mmol/L (ref 3.5–5.3)
SODIUM: 140 mmol/L (ref 135–146)
Total Protein: 7.6 g/dL (ref 6.1–8.1)

## 2016-06-21 LAB — LIPID PANEL
CHOL/HDL RATIO: 4.4 ratio (ref <5.0)
Cholesterol: 205 mg/dL — ABNORMAL HIGH (ref <200)
HDL: 47 mg/dL (ref >40)
LDL Cholesterol: 122 mg/dL — ABNORMAL HIGH (ref <100)
TRIGLYCERIDES: 179 mg/dL — AB (ref <150)
VLDL: 36 mg/dL — ABNORMAL HIGH (ref <30)

## 2016-07-09 ENCOUNTER — Encounter: Payer: Self-pay | Admitting: Physician Assistant

## 2016-07-10 MED ORDER — ALLOPURINOL 300 MG PO TABS: 300.0000 mg | ORAL_TABLET | Freq: Every day | ORAL | 1 refills | Status: DC

## 2016-07-10 MED ORDER — AMOXICILLIN 875 MG PO TABS: 875.0000 mg | ORAL_TABLET | Freq: Two times a day (BID) | ORAL | 0 refills | Status: DC

## 2016-07-21 ENCOUNTER — Encounter: Payer: Self-pay | Admitting: Physician Assistant

## 2016-08-28 ENCOUNTER — Encounter: Payer: Self-pay | Admitting: Family Medicine

## 2016-09-11 ENCOUNTER — Ambulatory Visit (INDEPENDENT_AMBULATORY_CARE_PROVIDER_SITE_OTHER): Payer: PRIVATE HEALTH INSURANCE | Admitting: Family Medicine

## 2016-09-11 ENCOUNTER — Encounter: Payer: Self-pay | Admitting: Family Medicine

## 2016-09-11 VITALS — BP 160/87 | HR 91 | Wt 254.0 lb

## 2016-09-11 DIAGNOSIS — G8929 Other chronic pain: Secondary | ICD-10-CM | POA: Diagnosis not present

## 2016-09-11 DIAGNOSIS — M545 Low back pain: Secondary | ICD-10-CM | POA: Diagnosis not present

## 2016-09-11 MED ORDER — PREDNISONE 5 MG (48) PO TBPK: ORAL_TABLET | ORAL | 0 refills | Status: DC

## 2016-09-11 NOTE — Patient Instructions (Addendum)
Thank you for coming in today. Take prednisone daily for 12 days.  Recheck if not better.  Come back or go to the emergency room if you notice new weakness new numbness problems walking or bowel or bladder problems.   Lumbosacral Strain Lumbosacral strain is an injury that causes pain in the lower back (lumbosacral spine). This injury usually occurs from overstretching the muscles or ligaments along your spine. A strain can affect one or more muscles or cord-like tissues that connect bones to other bones (ligaments). What are the causes? This condition may be caused by:  A hard, direct hit (blow) to the back.  Excessive stretching of the lower back muscles. This may result from: ? A fall. ? Lifting something heavy. ? Repetitive movements such as bending or crouching.  What increases the risk? The following factors may increase your risk of getting this condition:  Participating in sports or activities that involve: ? A sudden twist of the back. ? Pushing or pulling motions.  Being overweight or obese.  Having poor strength and flexibility, especially tight hamstrings or weak muscles in the back or abdomen.  Having too much of a curve in the lower back.  Having a pelvis that is tilted forward.  What are the signs or symptoms? The main symptom of this condition is pain in the lower back, at the site of the strain. Pain may extend (radiate) down one or both legs. How is this diagnosed? This condition is diagnosed based on:  Your symptoms.  Your medical history.  A physical exam. ? Your health care provider may push on certain areas of your back to determine the source of your pain. ? You may be asked to bend forward, backward, and side to side to assess the severity of your pain and your range of motion.  Imaging tests, such as: ? X-rays. ? MRI.  How is this treated? Treatment for this condition may include:  Putting heat and cold on the affected area.  Medicines  to help relieve pain and relax your muscles (muscle relaxants).  NSAIDs to help reduce swelling and discomfort.  When your symptoms improve, it is important to gradually return to your normal routine as soon as possible to reduce pain, avoid stiffness, and avoid loss of muscle strength. Generally, symptoms should improve within 6 weeks of treatment. However, recovery time varies. Follow these instructions at home: Managing pain, stiffness, and swelling   If directed, put ice on the injured area during the first 24 hours after your strain. ? Put ice in a plastic bag. ? Place a towel between your skin and the bag. ? Leave the ice on for 20 minutes, 2-3 times a day.  If directed, put heat on the affected area as often as told by your health care provider. Use the heat source that your health care provider recommends, such as a moist heat pack or a heating pad. ? Place a towel between your skin and the heat source. ? Leave the heat on for 20-30 minutes. ? Remove the heat if your skin turns bright red. This is especially important if you are unable to feel pain, heat, or cold. You may have a greater risk of getting burned. Activity  Rest and return to your normal activities as told by your health care provider. Ask your health care provider what activities are safe for you.  Avoid activities that take a lot of energy for as long as told by your health care provider. General instructions  Take over-the-counter and prescription medicines only as told by your health care provider.  Donot drive or use heavy machinery while taking prescription pain medicine.  Do not use any products that contain nicotine or tobacco, such as cigarettes and e-cigarettes. If you need help quitting, ask your health care provider.  Keep all follow-up visits as told by your health care provider. This is important. How is this prevented?  Use correct form when playing sports and lifting heavy objects.  Use good  posture when sitting and standing.  Maintain a healthy weight.  Sleep on a mattress with medium firmness to support your back.  Be safe and responsible while being active to avoid falls.  Do at least 150 minutes of moderate-intensity exercise each week, such as brisk walking or water aerobics. Try a form of exercise that takes stress off your back, such as swimming or stationary cycling.  Maintain physical fitness, including: ? Strength. ? Flexibility. ? Cardiovascular fitness. ? Endurance. Contact a health care provider if:  Your back pain does not improve after 6 weeks of treatment.  Your symptoms get worse. Get help right away if:  Your back pain is severe.  You cannot stand or walk.  You have difficulty controlling when you urinate or when you have a bowel movement.  You feel nauseous or you vomit.  Your feet get very cold.  You have numbness, tingling, weakness, or problems using your arms or legs.  You develop any of the following: ? Shortness of breath. ? Dizziness. ? Pain in your legs. ? Weakness in your buttocks or legs. ? Discoloration of the skin on your toes or legs. This information is not intended to replace advice given to you by your health care provider. Make sure you discuss any questions you have with your health care provider. Document Released: 11/27/2004 Document Revised: 09/07/2015 Document Reviewed: 07/22/2015 Elsevier Interactive Patient Education  2017 [employer]

## 2016-09-12 NOTE — Progress Notes (Signed)
   Thomas Noble is a 56 y.o. male who [facility] today for back pain. Patient notes chronic low back pain. He has occasional exacerbations of the back pain. He notes it's been a little worse recently. He notes right-sided low back pain without radiating pain weakness or numbness. He denies any bowel or bladder dysfunction. He feels well otherwise with no fevers or chills. Previously he's done well with steroid Dosepaks and would like a prescription of the steroid Dosepak if possible.   Past Medical History:  Diagnosis Date  . Essential hypertension, benign 11/22/2008   Qualifier: Diagnosis of  By: Thomos LemonsBowen DO, Karen    . Gout 11/22/2008   Qualifier: Diagnosis of  By: Thomos LemonsBowen DO, Karen    . HLD (hyperlipidemia) 11/22/2008   Qualifier: Diagnosis of  By: Thomos LemonsBowen DO, Karen     History reviewed. No pertinent surgical history. Social History  Substance Use Topics  . Smoking status: Never Smoker  . Smokeless tobacco: Current User    Types: Snuff  . Alcohol use Not on file     ROS:  As above   Medications: Current Outpatient Prescriptions  Medication Sig Dispense Refill  . allopurinol (ZYLOPRIM) 300 MG tablet Take 1 tablet (300 mg total) by mouth daily. 90 tablet 1  . amLODipine (NORVASC) 10 MG tablet TAKE 1 TABLET (10 MG TOTAL) BY MOUTH DAILY. 30 tablet 2  . pravastatin (PRAVACHOL) 80 MG tablet TAKE 1 TABLET (80 MG TOTAL) BY MOUTH DAILY. 30 tablet 2  . predniSONE (STERAPRED UNI-PAK 48 TAB) 5 MG (48) TBPK tablet 12 day dosepack po 48 tablet 0   No current facility-administered medications for this visit.    No Known Allergies   Exam:  BP (!) 160/87   Pulse 91   Wt 254 lb (115.2 kg)   SpO2 98%   BMI 31.75 kg/m  General: Well Developed, well nourished, and in no acute distress.  Neuro/Psych: Alert and oriented x3, extra-ocular muscles intact, able to move all 4 extremities, sensation grossly intact. Skin: Warm and dry, no rashes  noted.  Respiratory: Not using accessory muscles, speaking in full sentences, trachea midline.  Cardiovascular: Pulses palpable, no extremity edema. Abdomen: Does not appear distended. MSK: L-spine nontender to midline. Tender palpation right SI joint area. Low back motion is decreased. Lower extremity strength is equal and normal throughout. Normal gait.    No results found for this or any previous visit (from the past 48 hour(s)). No results found.    Assessment and Plan: 56 y.o. male with lumbago likely due to myofascial strain and possible exacerbation of underlying chronic back pain issues. Plan for oral prednisone Dosepak. Plan to continue home physical therapy exercises. If not improving would recommend referral to formal physical therapy.  Recommend patient follow-up with his primary care provider in the near future to address his elevated blood pressure.   FMLA paperwork filled out today.    No orders of the defined types were placed in this encounter.  Meds ordered this encounter  Medications  . predniSONE (STERAPRED UNI-PAK 48 TAB) 5 MG (48) TBPK tablet    Sig: 12 day dosepack po    Dispense:  48 tablet    Refill:  0    Discussed warning signs or symptoms. Please see discharge instructions. Patient expresses understanding.

## 2016-10-09 ENCOUNTER — Encounter: Payer: Self-pay | Admitting: Physician Assistant

## 2016-10-10 ENCOUNTER — Other Ambulatory Visit: Payer: Self-pay

## 2016-10-10 MED ORDER — AMLODIPINE BESYLATE 10 MG PO TABS: ORAL_TABLET | ORAL | 0 refills | Status: DC

## 2016-10-18 LAB — CBC AND DIFFERENTIAL
HEMATOCRIT: 42 (ref 41–53)
Hemoglobin: 14.9 (ref 13.5–17.5)
PLATELETS: 235 (ref 150–399)
WBC: 6.1

## 2016-10-18 LAB — HEMOGLOBIN A1C: HEMOGLOBIN A1C: 5.8

## 2016-10-18 LAB — TSH: TSH: 1.28 (ref 0.41–5.90)

## 2016-10-18 LAB — PSA: PSA: 1.77

## 2017-01-09 ENCOUNTER — Encounter: Payer: Self-pay | Admitting: Family Medicine

## 2017-01-09 MED ORDER — PREDNISONE 5 MG (48) PO TBPK: ORAL_TABLET | ORAL | 0 refills | Status: DC

## 2017-03-17 ENCOUNTER — Ambulatory Visit (INDEPENDENT_AMBULATORY_CARE_PROVIDER_SITE_OTHER): Payer: PRIVATE HEALTH INSURANCE | Admitting: Physician Assistant

## 2017-03-17 ENCOUNTER — Encounter: Payer: Self-pay | Admitting: Physician Assistant

## 2017-03-17 VITALS — BP 134/78 | HR 74 | Ht 75.0 in | Wt 256.0 lb

## 2017-03-17 DIAGNOSIS — M5137 Other intervertebral disc degeneration, lumbosacral region: Secondary | ICD-10-CM | POA: Diagnosis not present

## 2017-03-17 DIAGNOSIS — I10 Essential (primary) hypertension: Secondary | ICD-10-CM | POA: Diagnosis not present

## 2017-03-17 DIAGNOSIS — M51379 Other intervertebral disc degeneration, lumbosacral region without mention of lumbar back pain or lower extremity pain: Secondary | ICD-10-CM

## 2017-03-17 DIAGNOSIS — Z0000 Encounter for general adult medical examination without abnormal findings: Secondary | ICD-10-CM

## 2017-03-17 DIAGNOSIS — E7800 Pure hypercholesterolemia, unspecified: Secondary | ICD-10-CM

## 2017-03-17 DIAGNOSIS — M1A9XX0 Chronic gout, unspecified, without tophus (tophi): Secondary | ICD-10-CM

## 2017-03-17 NOTE — Progress Notes (Addendum)
Subjective:    Patient ID: Thomas Noble, male    DOB: 03/09/1960, 57 y.o.   MRN: [id number]  HPI Pt is a pleasant 57 year old male presenting to the clinic for his complete physical exam. [facility]   He is continuing to take his medications and has had no problems with side effects. He reportds been sleeping well and feeling well with no major complaints. He continues to have low back pain which is being managed by Dr. Denyse Amass. He takes 800mg  of ibuprofen daily to address this and continues intermittent home back exercises.  He also receives care at the [facility] and has brought his lab results from August to be evaluated.   .. Active Ambulatory Problems    Diagnosis Date Noted  . Gout 11/22/2008  . DEPRESSION 11/22/2008  . ESSENTIAL HYPERTENSION, BENIGN 11/22/2008  . ABSCESS, TOOTH 01/30/2010  . DISC DISEASE, CERVICAL 11/21/2008  . DISC DISEASE, LUMBAR 11/21/2008  . OSTEOPENIA 11/22/2008  . ALCOHOL ABUSE, HX OF 11/22/2008  . Chronic low back pain 06/14/2014  . Elevated fasting glucose 08/26/2015  . Pure hypercholesterolemia 06/20/2016   Resolved Ambulatory Problems    Diagnosis Date Noted  . HLD (hyperlipidemia) 11/22/2008  . ACUTE NASOPHARYNGITIS 12/18/2009  . GASTROENTERITIS WITHOUT DEHYDRATION 01/31/2009  . Muscle spasm of back 06/14/2014  . DDD (degenerative disc disease), lumbar 06/14/2014  . Left-sided low back pain without sciatica 02/12/2015  . Viral URI with cough 05/31/2015   Past Medical History:  Diagnosis Date  . Essential hypertension, benign 11/22/2008  . Gout 11/22/2008  . HLD (hyperlipidemia) 11/22/2008   .Marland KitchenHistory reviewed. No pertinent family history.  .. Social History   Socioeconomic History  . Marital status: Married    Spouse name: Not on file  . Number of children: Not on file  . Years of education: Not on file  . Highest education level: Not on file  Social Needs  . Financial resource strain: Not on file  .  Food insecurity - worry: Not on file  . Food insecurity - inability: Not on file  . Transportation needs - medical: Not on file  . Transportation needs - non-medical: Not on file  Occupational History  . Not on file  Tobacco Use  . Smoking status: Never Smoker  . Smokeless tobacco: Current User    Types: Snuff  Substance and Sexual Activity  . Alcohol use: Not on file  . Drug use: Not on file  . Sexual activity: Not on file  Other Topics Concern  . Not on file  Social History Narrative  . Not on file     Review of Systems  Constitutional: Negative for chills, fatigue and fever.  HENT: Negative for postnasal drip, rhinorrhea, sinus pressure and sinus pain.   Respiratory: Negative for chest tightness and shortness of breath.   Cardiovascular: Negative for chest pain.  Gastrointestinal: Negative for constipation, diarrhea and nausea.  Genitourinary: Negative for difficulty urinating and urgency.       Objective:   Physical Exam  Constitutional: He is oriented to person, place, and time. He appears well-developed and well-nourished. No distress.  HENT:  Head: Normocephalic and atraumatic.  Right Ear: External ear normal.  Left Ear: External ear normal.  Some cerumen in the right ear.   Eyes: Conjunctivae and EOM are normal. Pupils are equal, round, and reactive to light.  Cardiovascular: Normal rate, regular rhythm and normal heart sounds. Exam reveals no gallop and no  friction rub.  No murmur heard. Pulmonary/Chest: Effort normal and breath sounds normal.  Abdominal: Soft. Bowel sounds are normal. He exhibits no distension. There is no tenderness.  Neurological: He is alert and oriented to person, place, and time. He displays normal reflexes.  Psychiatric: He has a normal mood and affect. His behavior is normal.    Vitals:   03/17/17 0834 03/17/17 0914  BP: 140/72 134/78  Pulse: 74          Assessment & Plan:    Thomas Noble was seen today for annual  exam.  Diagnoses and all orders for this visit:  Routine physical examination  Essential hypertension, benign  DISC DISEASE, LUMBAR  Chronic gout without tophus, unspecified cause, unspecified site  Pure hypercholesterolemia  .[name]. Depression screen [facility] 2[facility] 03/17/2017 06/20/2016  Decreased Interest 0 0  Down, Depressed, Hopeless 0 0  PHQ - 2 Score 0 0   .[name].Start a regular exercise program and make sure you are eating a healthy diet Try to eat 4 servings of dairy a day or take a calcium supplement (500mg  twice a day). Your vaccines are up to date.  AUA was 2. PSA normal. Declined DRE today.   Thomas Noble was given Agricultural engineereducational material on the Shingrix vaccine and Cologuard. He will be doing the Cologuard home test and has signed consent to have the test sent to him. He was instructed to begin a 81mg  aspirin a day and continue his home exercises for his back. [facility] with [facility](since seems to be [facility] or [facility]) to get a repeat lipid panel, CMP, Hepatitis C testing, and Shingrix vaccine at his next visit with them. If he has any questions or concerns he can contact our office or message me on MyChart.

## 2017-03-17 NOTE — Patient Instructions (Addendum)
Get cologuard. Consider starting ASA 81mg  daily.   Needs hep C screening.  Consider shingrix 2 dose vaccine series.  Needs Lipid panel and CMP.     Keeping you healthy  Get these tests  Blood pressure- Have your blood pressure checked once a year by your healthcare provider.  Normal blood pressure is 120/80  Weight- Have your body mass index (BMI) calculated to screen for obesity.  BMI is a measure of body fat based on height and weight. You can also calculate your own BMI at [email or web address]/.  Cholesterol- Have your cholesterol checked every year.  Diabetes- Have your blood sugar checked regularly if you have high blood pressure, high cholesterol, have a family history of diabetes or if you are overweight.  Screening for Colon Cancer- Colonoscopy starting at age 58.  Screening may begin sooner depending on your family history and other health conditions. Follow up colonoscopy as directed by your Gastroenterologist.  Screening for Prostate Cancer- Both blood work (PSA) and a rectal exam help screen for Prostate Cancer.  Screening begins at age 58 with African-American men and at age 58 with Caucasian men.  Screening may begin sooner depending on your family history.  Take these medicines  Aspirin- One aspirin daily can help prevent Heart disease and Stroke.  Flu shot- Every fall.  Tetanus- Every 10 years.  Zostavax- Once after the age of 58 to prevent Shingles.  Pneumonia shot- Once after the age of 58; if you are younger than 3465, ask your healthcare provider if you need a Pneumonia shot.  Take these steps  Don't smoke- If you do smoke, talk to your doctor about quitting.  For tips on how to quit, go to www.smokefree.gov or call 1-800-QUIT-NOW.  Be physically active- Exercise 5 days a week for at least 30 minutes.  If you are not already physically active start slow and gradually work up to 30 minutes of moderate physical activity.  Examples of moderate activity  include walking briskly, mowing the yard, dancing, swimming, bicycling, etc.  Eat a healthy diet- Eat a variety of healthy food such as fruits, vegetables, low fat milk, low fat cheese, yogurt, lean meant, poultry, fish, beans, tofu, etc. For more information go to www.thenutritionsource.org  Drink alcohol in moderation- Limit alcohol intake to less than two drinks a day. Never drink and drive.  Dentist- Brush and floss twice daily; visit your dentist twice a year.  Depression- Your emotional health is as important as your physical health. If you're feeling down, or losing interest in things you would normally enjoy please talk to your healthcare provider.  Eye exam- Visit your eye doctor every year.  Safe sex- If you may be exposed to a sexually transmitted infection, use a condom.  Seat belts- Seat belts can save your life; always wear one.  Smoke/Carbon Monoxide detectors- These detectors need to be installed on the appropriate level of your home.  Replace batteries at least once a year.  Skin cancer- When out in the sun, cover up and use sunscreen 15 SPF or higher.  Violence- If anyone is threatening you, please tell your healthcare provider.  Living Will/ Health care power of attorney- Speak with your healthcare provider and family.

## 2017-03-23 ENCOUNTER — Encounter: Payer: Self-pay | Admitting: Physician Assistant

## 2017-06-26 ENCOUNTER — Ambulatory Visit: Payer: PRIVATE HEALTH INSURANCE | Admitting: Family Medicine

## 2017-06-26 ENCOUNTER — Encounter: Payer: Self-pay | Admitting: Family Medicine

## 2017-06-26 VITALS — BP 147/83 | HR 76 | Temp 97.9°F | Wt 251.2 lb

## 2017-06-26 DIAGNOSIS — I10 Essential (primary) hypertension: Secondary | ICD-10-CM

## 2017-06-26 DIAGNOSIS — M545 Low back pain, unspecified: Secondary | ICD-10-CM

## 2017-06-26 DIAGNOSIS — G8929 Other chronic pain: Secondary | ICD-10-CM

## 2017-06-26 DIAGNOSIS — M5137 Other intervertebral disc degeneration, lumbosacral region: Secondary | ICD-10-CM | POA: Diagnosis not present

## 2017-06-26 MED ORDER — PREDNISONE 5 MG (48) PO TBPK: ORAL_TABLET | ORAL | 0 refills | Status: DC

## 2017-06-26 MED ORDER — IBUPROFEN 800 MG PO TABS: 800.0000 mg | ORAL_TABLET | Freq: Three times a day (TID) | ORAL | 2 refills | Status: DC | PRN

## 2017-06-26 NOTE — Patient Instructions (Addendum)
Thank you for coming in today. Take the prednisone for 12 days.  Use ibuprofen for pain when not taking the prednisone.  Use ibuprofen sparingly.  Recheck with me as needed.   Come back or go to the emergency room if you notice new weakness new numbness problems walking or bowel or bladder problems.

## 2017-06-26 NOTE — Progress Notes (Signed)
Thomas Noble is a 57 y.o. male who presents to [facility] today for back pain. Patient notes chronic low back pain. He has occasional exacerbations of the back pain. He notes that it has been worse over the last week.  He notes bilateral low back pain without radiating pain weakness or numbness. He denies any bowel or bladder dysfunction. He feels well otherwise with no fevers or chills. Previously he's done well with steroid Dosepaks and would like a prescription of the steroid Dosepak if possible.  Additionally he would like to use ibuprofen intermittently.  Hypertension: Thomas Noble has a history of hypertension.  He takes amlodipine daily.  He notes on recheck his blood pressure is usually a lot better.  He feels well with no chest pain palpitations or shortness of breath.   Past Medical History:  Diagnosis Date  . Essential hypertension, benign 11/22/2008   Qualifier: Diagnosis of  By: [name]    . Gout 11/22/2008   Qualifier: Diagnosis of  By: [name]    . HLD (hyperlipidemia) 11/22/2008   Qualifier: Diagnosis of  By: Cathey Endow DO, Karen     No past surgical history on file. Social History   Tobacco Use  . Smoking status: Never Smoker  . Smokeless tobacco: Current User    Types: Snuff  Substance Use Topics  . Alcohol use: Not on file     ROS:  As above   Medications: Current Outpatient Medications  Medication Sig Dispense Refill  . allopurinol (ZYLOPRIM) 300 MG tablet Take 1 tablet (300 mg total) by mouth daily. 90 tablet 1  . amLODipine (NORVASC) 10 MG tablet TAKE 1 TABLET (10 MG TOTAL) BY MOUTH DAILY. 30 tablet 0  . pravastatin (PRAVACHOL) 80 MG tablet TAKE 1 TABLET (80 MG TOTAL) BY MOUTH DAILY. 30 tablet 2  . ibuprofen (ADVIL,MOTRIN) 800 MG tablet Take 1 tablet (800 mg total) by mouth every 8 (eight) hours as needed. 60 tablet 2  . predniSONE (STERAPRED UNI-PAK 48 TAB) 5 MG (48) TBPK tablet 12 day dosepack po 48 tablet 0    No current facility-administered medications for this visit.    No Known Allergies   Exam:  BP (!) 147/83   Pulse 76   Temp 97.9 F (36.6 C) (Oral)   Wt 251 lb 3.2 oz (113.9 kg)   BMI 31.40 kg/m  General: Well Developed, well nourished, and in no acute distress.  Neuro/Psych: Alert and oriented x3, extra-ocular muscles intact, able to move all 4 extremities, sensation grossly intact. Skin: Warm and dry, no rashes noted.  Respiratory: Not using accessory muscles, speaking in full sentences, trachea midline.  Cardiovascular: Pulses palpable, no extremity edema. Abdomen: Does not appear distended. MSK: L-spine nontender to midline. Tender palpation bilateral lumbar paraspinal muscle groups.  Low back motion is decreased. Lower extremity strength is equal and normal throughout. Normal gait.      Assessment and Plan: 57 y.o. male with lumbago likely due to myofascial strain and possible exacerbation of underlying chronic back pain issues. Plan for oral prednisone Dosepak. Plan to continue home physical therapy exercises. If not improving would recommend referral to formal physical therapy.  Hypertension: Blood pressure elevated.  Recommend follow-up with PCP.   We will renew FMLA paperwork when it is due in July.  No orders of the defined types were placed in this encounter.  Meds ordered this encounter  Medications  . predniSONE (STERAPRED UNI-PAK 48 TAB) 5 MG (48) TBPK  tablet    Sig: 12 day dosepack po    Dispense:  48 tablet    Refill:  0  . ibuprofen (ADVIL,MOTRIN) 800 MG tablet    Sig: Take 1 tablet (800 mg total) by mouth every 8 (eight) hours as needed.    Dispense:  60 tablet    Refill:  2    Discussed warning signs or symptoms. Please see discharge instructions. Patient expresses understanding.

## 2017-09-11 ENCOUNTER — Encounter: Payer: Self-pay | Admitting: Family Medicine

## 2017-09-11 NOTE — Progress Notes (Signed)
FMLA form completed will be faxed today or Monday.  We will keep a copy and send a copy to scan.

## 2017-09-17 LAB — LIPID PANEL
Cholesterol: 144 (ref 0–200)
HDL: 50 (ref 35–70)
LDL Cholesterol: 56
Triglycerides: 191 — AB (ref 40–160)

## 2017-09-17 LAB — BASIC METABOLIC PANEL: Glucose: 119

## 2018-01-05 ENCOUNTER — Ambulatory Visit: Payer: PRIVATE HEALTH INSURANCE | Admitting: Sports Medicine

## 2018-01-05 ENCOUNTER — Encounter: Payer: Self-pay | Admitting: Sports Medicine

## 2018-01-05 DIAGNOSIS — G8929 Other chronic pain: Secondary | ICD-10-CM

## 2018-01-05 DIAGNOSIS — M545 Low back pain: Secondary | ICD-10-CM

## 2018-01-05 MED ORDER — CYCLOBENZAPRINE HCL 10 MG PO TABS: ORAL_TABLET | ORAL | 0 refills | Status: DC

## 2018-01-05 MED ORDER — PREDNISONE 10 MG (48) PO TBPK: ORAL_TABLET | Freq: Every day | ORAL | 0 refills | Status: DC

## 2018-01-05 NOTE — Assessment & Plan Note (Signed)
Multilevel DDD, discogenic axial back pain without radicular pain, no bowel or bladder dysfunction. Typically prednisone once or twice a year tends to help his pain significantly, adding a prednisone taper per his request as well as Flexeril at bedtime. Return to see me as needed.

## 2018-01-05 NOTE — Progress Notes (Signed)
Subjective:    I'm seeing this patient as a consultation for: Thomas Gaw, PA-C  CC: Low back pain  HPI: This is a pleasant 57 year old male, he has known lumbar degenerative disc disease, pain is axial and worse with sitting, flexion, Valsalva, no bowel or bladder dysfunction.  No saddle numbness, no constitutional symptoms or trauma.  I reviewed the past medical history, family history, social history, surgical history, and allergies today and no changes were needed.  Please see the problem list section below in epic for further details.  Past Medical History: Past Medical History:  Diagnosis Date  . Essential hypertension, benign 11/22/2008   Qualifier: Diagnosis of  By: [name]    . Gout 11/22/2008   Qualifier: Diagnosis of  By: [name]    . HLD (hyperlipidemia) 11/22/2008   Qualifier: Diagnosis of  By: [name]     Past Surgical History: No past surgical history on file. Social History: Social History   Socioeconomic History  . Marital status: Married    Spouse name: Not on file  . Number of children: Not on file  . Years of education: Not on file  . Highest education level: Not on file  Occupational History  . Not on file  Social Needs  . Financial resource strain: Not on file  . Food insecurity:    Worry: Not on file    Inability: Not on file  . Transportation needs:    Medical: Not on file    Non-medical: Not on file  Tobacco Use  . Smoking status: Never Smoker  . Smokeless tobacco: Current User    Types: Snuff  Substance and Sexual Activity  . Alcohol use: Not on file  . Drug use: Not on file  . Sexual activity: Not on file  Lifestyle  . Physical activity:    Days per week: Not on file    Minutes per session: Not on file  . Stress: Not on file  Relationships  . Social connections:    Talks on phone: Not on file    Gets together: Not on file    Attends religious service: Not on file    Active member of club or organization:  Not on file    Attends meetings of clubs or organizations: Not on file    Relationship status: Not on file  Other Topics Concern  . Not on file  Social History Narrative  . Not on file   Family History: No family history on file. Allergies: No Known Allergies Medications: See med rec.  Review of Systems: No headache, visual changes, nausea, vomiting, diarrhea, constipation, dizziness, abdominal pain, skin rash, fevers, chills, night sweats, weight loss, swollen lymph nodes, body aches, joint swelling, muscle aches, chest pain, shortness of breath, mood changes, visual or auditory hallucinations.   Objective:   General: Well Developed, well nourished, and in no acute distress.  Neuro:  Extra-ocular muscles intact, able to move all 4 extremities, sensation grossly intact.  Deep tendon reflexes tested were normal. Psych: Alert and oriented, mood congruent with affect. ENT:  Ears and nose appear unremarkable.  Hearing grossly normal. Neck: Unremarkable overall appearance, trachea midline.  No visible thyroid enlargement. Eyes: Conjunctivae and lids appear unremarkable.  Pupils equal and round. Skin: Warm and dry, no rashes noted.  Cardiovascular: Pulses palpable, no extremity edema. Back Exam:  Inspection: Unremarkable  Motion: Flexion 45 deg, Extension 45 deg, Side Bending to 45 deg bilaterally,  Rotation to 45 deg  bilaterally  SLR laying: Negative  XSLR laying: Negative  Palpable tenderness: None. FABER: negative. Sensory change: Gross sensation intact to all lumbar and sacral dermatomes.  Reflexes: 2+ at both patellar tendons, 2+ at achilles tendons, Babinski's downgoing.  Strength at foot  Plantar-flexion: 5/5 Dorsi-flexion: 5/5 Eversion: 5/5 Inversion: 5/5  Leg strength  Quad: 5/5 Hamstring: 5/5 Hip flexor: 5/5 Hip abductors: 5/5  Gait unremarkable.  Impression and Recommendations:   This case required medical decision making of moderate complexity.  Chronic low back  pain Multilevel DDD, discogenic axial back pain without radicular pain, no bowel or bladder dysfunction. Typically prednisone once or twice a year tends to help his pain significantly, adding a prednisone taper per his request as well as Flexeril at bedtime. Return to see me as needed. ___________________________________________ Thomas Noble. Thomas Noble, M.D., ABFM., CAQSM. Primary Care and Sports Medicine [facility]  Adjunct Professor of Family Medicine  [employer]

## 2018-04-13 ENCOUNTER — Ambulatory Visit (INDEPENDENT_AMBULATORY_CARE_PROVIDER_SITE_OTHER): Payer: PRIVATE HEALTH INSURANCE | Admitting: Physician Assistant

## 2018-04-13 ENCOUNTER — Encounter: Payer: Self-pay | Admitting: Physician Assistant

## 2018-04-13 VITALS — BP 148/88 | HR 88 | Temp 98.7°F | Ht 75.0 in | Wt 258.0 lb

## 2018-04-13 DIAGNOSIS — I10 Essential (primary) hypertension: Secondary | ICD-10-CM | POA: Diagnosis not present

## 2018-04-13 DIAGNOSIS — Z0000 Encounter for general adult medical examination without abnormal findings: Secondary | ICD-10-CM

## 2018-04-13 DIAGNOSIS — E782 Mixed hyperlipidemia: Secondary | ICD-10-CM | POA: Diagnosis not present

## 2018-04-13 DIAGNOSIS — Z131 Encounter for screening for diabetes mellitus: Secondary | ICD-10-CM

## 2018-04-13 DIAGNOSIS — M5137 Other intervertebral disc degeneration, lumbosacral region: Secondary | ICD-10-CM

## 2018-04-13 DIAGNOSIS — M109 Gout, unspecified: Secondary | ICD-10-CM

## 2018-04-13 DIAGNOSIS — J0100 Acute maxillary sinusitis, unspecified: Secondary | ICD-10-CM

## 2018-04-13 DIAGNOSIS — Z1159 Encounter for screening for other viral diseases: Secondary | ICD-10-CM

## 2018-04-13 DIAGNOSIS — F1021 Alcohol dependence, in remission: Secondary | ICD-10-CM

## 2018-04-13 MED ORDER — AMOXICILLIN-POT CLAVULANATE 875-125 MG PO TABS: 1.0000 | ORAL_TABLET | Freq: Two times a day (BID) | ORAL | 0 refills | Status: DC

## 2018-04-13 MED ORDER — CYCLOBENZAPRINE HCL 10 MG PO TABS: 10.0000 mg | ORAL_TABLET | Freq: Three times a day (TID) | ORAL | 0 refills | Status: DC | PRN

## 2018-04-13 NOTE — Progress Notes (Signed)
Subjective:    Patient ID: Thomas Noble, male    DOB: 08/26/1960, 58 y.o.   MRN: [id number]  HPI Pt is a 58 yo male who presents to the clinic for CPE.   He is having sinus pressure, congestion for over 2 weeks. Taking flonase, mucinex, sudafed with little relief. No fever, chills, sob, cough.   .. Active Ambulatory Problems    Diagnosis Date Noted  . Gout 11/22/2008  . DEPRESSION 11/22/2008  . ESSENTIAL HYPERTENSION, BENIGN 11/22/2008  . ABSCESS, TOOTH 01/30/2010  . DISC DISEASE, CERVICAL 11/21/2008  . DISC DISEASE, LUMBAR 11/21/2008  . OSTEOPENIA 11/22/2008  . ALCOHOL ABUSE, HX OF 11/22/2008  . Chronic low back pain 06/14/2014  . Elevated fasting glucose 08/26/2015  . Pure hypercholesterolemia 06/20/2016   Resolved Ambulatory Problems    Diagnosis Date Noted  . HLD (hyperlipidemia) 11/22/2008  . ACUTE NASOPHARYNGITIS 12/18/2009  . GASTROENTERITIS WITHOUT DEHYDRATION 01/31/2009  . Muscle spasm of back 06/14/2014  . DDD (degenerative disc disease), lumbar 06/14/2014  . Left-sided low back pain without sciatica 02/12/2015  . Viral URI with cough 05/31/2015   No Additional Past Medical History   .Marland KitchenHistory reviewed. No pertinent family history. .. Social History   Socioeconomic History  . Marital status: Married    Spouse name: Not on file  . Number of children: Not on file  . Years of education: Not on file  . Highest education level: Not on file  Occupational History  . Not on file  Social Needs  . Financial resource strain: Not on file  . Food insecurity:    Worry: Not on file    Inability: Not on file  . Transportation needs:    Medical: Not on file    Non-medical: Not on file  Tobacco Use  . Smoking status: Never Smoker  . Smokeless tobacco: Current User    Types: Snuff  Substance and Sexual Activity  . Alcohol use: Not on file  . Drug use: Not on file  . Sexual activity: Not on file  Lifestyle  . Physical activity:    Days per week: Not on file     Minutes per session: Not on file  . Stress: Not on file  Relationships  . Social connections:    Talks on phone: Not on file    Gets together: Not on file    Attends religious service: Not on file    Active member of club or organization: Not on file    Attends meetings of clubs or organizations: Not on file    Relationship status: Not on file  . Intimate partner violence:    Fear of current or ex partner: Not on file    Emotionally abused: Not on file    Physically abused: Not on file    Forced sexual activity: Not on file  Other Topics Concern  . Not on file  Social History Narrative  . Not on file      Review of Systems  All other systems reviewed and are negative.      Objective:   Physical Exam BP (!) 148/88   Pulse 88   Temp 98.7 F (37.1 C) (Oral)   Ht 6\' 3"  (1.905 m)   Wt 258 lb (117 kg)   SpO2 100%   BMI 32.25 kg/m   General Appearance:    Alert, cooperative, no distress, appears stated age  Head:    Normocephalic, without obvious abnormality, atraumatic  Eyes:    PERRL, conjunctiva/corneas  clear, EOM's intact, fundi    benign, both eyes       Ears:    Normal TM's and external ear canals, both ears  Nose:   Nares normal, septum midline, mucosa normal, no drainage    or maxillary sinus tenderness to palpation.   Throat:   Lips, mucosa, and tongue normal; teeth and gums normal  Neck:   Supple, symmetrical, trachea midline, no adenopathy;       thyroid:  No enlargement/tenderness/nodules; no carotid   bruit or JVD  Back:     Symmetric, no curvature, ROM normal, no CVA tenderness  Lungs:     Clear to auscultation bilaterally, respirations unlabored  Chest wall:    No tenderness or deformity  Heart:    Regular rate and rhythm, S1 and S2 normal, no murmur, rub   or gallop  Abdomen:     Soft, non-tender, bowel sounds active all four quadrants,    no masses, no organomegaly        Extremities:   Extremities normal, atraumatic, no cyanosis or edema   Pulses:   2+ and symmetric all extremities  Skin:   Skin color, texture, turgor normal, no rashes or lesions  Lymph nodes:   Cervical, supraclavicular, and axillary nodes normal  Neurologic:   CNII-XII intact. Normal strength, sensation and reflexes      throughout         Assessment & Plan:  .[name].Thomas Noble was seen today for annual exam.  Diagnoses and all orders for this visit:  Routine physical examination  ALCOHOL ABUSE, HX OF  Essential hypertension, benign -     COMPLETE METABOLIC PANEL WITH GFR  Mixed hyperlipidemia -     Lipid Panel w/reflex Direct LDL  Need for hepatitis C screening test -     Hepatitis C Antibody  Screening for diabetes mellitus -     COMPLETE METABOLIC PANEL WITH GFR  Gout of big toe -     Uric acid  Subacute maxillary sinusitis -     amoxicillin-clavulanate (AUGMENTIN) 875-125 MG tablet; Take 1 tablet by mouth 2 (two) times daily.  DISC DISEASE, LUMBAR -     cyclobenzaprine (FLEXERIL) 10 MG tablet; Take 1 tablet (10 mg total) by mouth 3 (three) times daily as needed for muscle spasms.   .. [facility] 04/13/2018 03/17/2017 06/20/2016  Decreased Interest 0 0 0  Down, Depressed, Hopeless 0 0 0  PHQ - 2 Score 0 0 0  Altered sleeping 0 - -  Tired, decreased energy 0 - -  Change in appetite 0 - -  Feeling bad or failure about yourself  0 - -  Trouble concentrating 0 - -  Moving slowly or fidgety/restless 0 - -  Suicidal thoughts 0 - -  PHQ-9 Score 0 - -  Difficult doing work/chores Not difficult at all - -   .[name].Start a regular exercise program and make sure you are eating a healthy diet Try to eat 4 servings of dairy a day or take a calcium supplement (500mg  twice a day). Needs shringrix. Vaccines HO given. Pt will consider.  Fasting labs ordered.  Colonoscopy declined. cologuard declined. Pt aware of risk of not screening.  Prostate screen: PSA done and normal in 2018. AUA mild.   Hx of gout- will check uric acid. On  allopurinol. No gout attacks.   Treated with abx with augmentin and flonase. HO given. Follow up as needed.   [name]..IPSS Questionnaire (AUA-7): Over the past month.  1)  How often have you had a sensation of not emptying your bladder completely after you finish urinating?  0 - Not at all  2)  How often have you had to urinate again less than two hours after you finished urinating? 0 - Not at all  3)  How often have you found you stopped and started again several times when you urinated?  0 - Not at all  4) How difficult have you found it to postpone urination?  1 - Less than 1 time in 5  5) How often have you had a weak urinary stream?  0 - Not at all  6) How often have you had to push or strain to begin urination?  0 - Not at all  7) How many times did you most typically get up to urinate from the time you went to bed until the time you got up in the morning?  0 - None  Total score:  0-7 mildly symptomatic   8-19 moderately symptomatic   20-35 severely symptomatic

## 2018-04-13 NOTE — Patient Instructions (Signed)

## 2018-06-24 ENCOUNTER — Encounter: Payer: Self-pay | Admitting: Physician Assistant

## 2018-08-25 LAB — LIPID PANEL
Cholesterol: 144 (ref 0–200)
HDL: 50 (ref 35–70)
LDL Cholesterol: 56
Triglycerides: 191 — AB (ref 40–160)

## 2018-09-08 ENCOUNTER — Other Ambulatory Visit: Payer: Self-pay | Admitting: Physician Assistant

## 2018-09-08 ENCOUNTER — Encounter: Payer: Self-pay | Admitting: Physician Assistant

## 2018-09-08 DIAGNOSIS — M5137 Other intervertebral disc degeneration, lumbosacral region: Secondary | ICD-10-CM

## 2018-09-08 MED ORDER — CYCLOBENZAPRINE HCL 10 MG PO TABS: 10.0000 mg | ORAL_TABLET | Freq: Three times a day (TID) | ORAL | 0 refills | Status: DC | PRN

## 2018-09-10 ENCOUNTER — Encounter: Payer: Self-pay | Admitting: Physician Assistant

## 2018-09-13 LAB — LIPID PANEL
Cholesterol: 193 (ref 0–200)
HDL: 46 (ref 35–70)
LDL Cholesterol: 111
Triglycerides: 179 — AB (ref 40–160)

## 2018-09-13 LAB — HEMOGLOBIN A1C: Hemoglobin A1C: 5.1

## 2018-09-13 LAB — BASIC METABOLIC PANEL: Glucose: 88

## 2018-10-19 ENCOUNTER — Telehealth: Payer: Self-pay | Admitting: Family Medicine

## 2018-10-19 NOTE — Telephone Encounter (Signed)
Updated and completed repeat FMLA form for work.

## 2018-11-11 ENCOUNTER — Other Ambulatory Visit: Payer: Self-pay | Admitting: Physician Assistant

## 2018-11-11 DIAGNOSIS — M5137 Other intervertebral disc degeneration, lumbosacral region: Secondary | ICD-10-CM

## 2018-11-11 DIAGNOSIS — J0100 Acute maxillary sinusitis, unspecified: Secondary | ICD-10-CM

## 2018-11-12 MED ORDER — CYCLOBENZAPRINE HCL 10 MG PO TABS: 10.0000 mg | ORAL_TABLET | Freq: Three times a day (TID) | ORAL | 0 refills | Status: DC | PRN

## 2018-11-12 MED ORDER — AMOXICILLIN-POT CLAVULANATE 875-125 MG PO TABS: 1.0000 | ORAL_TABLET | Freq: Two times a day (BID) | ORAL | 0 refills | Status: DC

## 2018-11-12 NOTE — Telephone Encounter (Signed)
I cant just send abx without visit. It is against office policy. But I can refill flexeril.

## 2019-02-02 ENCOUNTER — Encounter: Payer: Self-pay | Admitting: Physician Assistant

## 2019-03-07 ENCOUNTER — Encounter: Payer: Self-pay | Admitting: Physician Assistant

## 2019-03-07 ENCOUNTER — Telehealth (INDEPENDENT_AMBULATORY_CARE_PROVIDER_SITE_OTHER): Payer: PRIVATE HEALTH INSURANCE | Admitting: Physician Assistant

## 2019-03-07 VITALS — Temp 97.7°F | Ht 75.0 in | Wt 258.0 lb

## 2019-03-07 DIAGNOSIS — R519 Headache, unspecified: Secondary | ICD-10-CM

## 2019-03-07 DIAGNOSIS — R52 Pain, unspecified: Secondary | ICD-10-CM

## 2019-03-07 DIAGNOSIS — R110 Nausea: Secondary | ICD-10-CM | POA: Diagnosis not present

## 2019-03-07 DIAGNOSIS — R6883 Chills (without fever): Secondary | ICD-10-CM | POA: Diagnosis not present

## 2019-03-07 NOTE — Progress Notes (Signed)
Patient ID: Thomas Noble, male   DOB: 06-Jun-1960, 59 y.o.   MRN: [id number] .Marland KitchenVirtual Visit via Telephone Note  I connected with Thomas Noble on 03/07/19 at  8:50 AM EST by telephone and verified that I am speaking with the correct person using two identifiers.  Location: Patient: home Provider: clinic   I discussed the limitations, risks, security and privacy concerns of performing an evaluation and management service by telephone and the availability of in person appointments. I also discussed with the patient that there may be a patient responsible charge related to this service. The patient expressed understanding and agreed to proceed.   History of Present Illness: Patient is a 59 year old male who calls into the clinic with 2 days of headache, body aches, chills, nausea.  He continues to work and wanted to make sure nothing else needs to be done as far as Covid testing.  He denies any loss of smell or taste, fever, cough, shortness of breath.  He denies any sick contacts.  He denies any direct Covid exposure.  .. Active Ambulatory Problems    Diagnosis Date Noted  . Gout 11/22/2008  . DEPRESSION 11/22/2008  . ESSENTIAL HYPERTENSION, BENIGN 11/22/2008  . ABSCESS, TOOTH 01/30/2010  . [place] DISEASE, CERVICAL 11/21/2008  . [place] DISEASE, LUMBAR 11/21/2008  . OSTEOPENIA 11/22/2008  . ALCOHOL ABUSE, HX OF 11/22/2008  . Chronic low back pain 06/14/2014  . Elevated fasting glucose 08/26/2015  . Pure hypercholesterolemia 06/20/2016   Resolved Ambulatory Problems    Diagnosis Date Noted  . HLD (hyperlipidemia) 11/22/2008  . ACUTE NASOPHARYNGITIS 12/18/2009  . GASTROENTERITIS WITHOUT DEHYDRATION 01/31/2009  . Muscle spasm of back 06/14/2014  . DDD (degenerative disc disease), lumbar 06/14/2014  . Left-sided low back pain without sciatica 02/12/2015  . Viral URI with cough 05/31/2015   No Additional Past Medical History   Reviewed med, allergy, problem list.      Observations/Objective: No acute distress. Normal breathing. No wheezing heard.  Normal appearance. Normal mood.   .. Today's Vitals   03/07/19 0823  Temp: 97.7 F (36.5 C)  TempSrc: Oral  Weight: 258 lb (117 kg)  Height: 6\' 3"  (1.905 m)   Body mass index is 32.25 kg/m.    Assessment and Plan: .Marland KitchenCordera was seen today for headache.  Diagnoses and all orders for this visit:  Acute nonintractable headache, unspecified headache type -     Novel Coronavirus, NAA ([facility]) -     POCT Influenza A/B  Body aches -     Novel Coronavirus, NAA ([facility]) -     POCT Influenza A/B  Chills -     Novel Coronavirus, NAA ([facility]) -     POCT Influenza A/B  Nausea -     Novel Coronavirus, NAA ([facility]) -     POCT Influenza A/B   Patient does need to be Covid tested.  He should be written out of work until his test results come back.  He is going to come through our drive-through.  Flu was negative.  Discussed symptomatic care with Tylenol Cold sinus severe and Flonase.  Encouraged patient to stay hydrated.  Consider vitamin C and zinc for immune support.  Call back or go to the emergency room/urgent care with any sudden worsening shortness of breath or problems breathing.       Follow Up Instructions:    I discussed the assessment and treatment plan with the patient. The patient was provided an opportunity to ask questions and all were answered. The  patient agreed with the plan and demonstrated an understanding of the instructions.   The patient was advised to call back or seek an in-person evaluation if the symptoms worsen or if the condition fails to improve as anticipated.     Tandy Gaw, PA-C

## 2019-03-07 NOTE — Progress Notes (Deleted)
Started yesterday:  Headache  Nausea Body aches  No sore throat, cough, congestion, fever, change in taste/smell  Has not taken anything for symptoms

## 2019-03-08 ENCOUNTER — Encounter: Payer: Self-pay | Admitting: Physician Assistant

## 2019-03-09 LAB — NOVEL CORONAVIRUS, NAA: SARS-CoV-2, NAA: NOT DETECTED

## 2019-03-09 NOTE — Progress Notes (Signed)
COVID not detected. How are symptoms?

## 2019-03-15 LAB — BASIC METABOLIC PANEL
BUN: 13 (ref 4–21)
CO2: 26 — AB (ref 13–22)
Chloride: 104 (ref 99–108)
Creatinine: 0.8 (ref 0.6–1.3)
Glucose: 91
Potassium: 4.1 (ref 3.4–5.3)
Sodium: 138 (ref 137–147)

## 2019-03-15 LAB — COMPREHENSIVE METABOLIC PANEL
Albumin: 4.3 (ref 3.5–5.0)
Calcium: 9.6 (ref 8.7–10.7)

## 2019-03-15 LAB — CBC AND DIFFERENTIAL
HCT: 40 — AB (ref 41–53)
Hemoglobin: 13.7 (ref 13.5–17.5)
Neutrophils Absolute: 8
Platelets: 234 (ref 150–399)
WBC: 11.2

## 2019-03-15 LAB — HEPATIC FUNCTION PANEL
ALT: 10 (ref 10–40)
AST: 15 (ref 14–40)
Alkaline Phosphatase: 36 (ref 25–125)
Bilirubin, Total: 1.1

## 2019-03-15 LAB — CBC: RBC: 4.91 (ref 3.87–5.11)

## 2019-03-15 LAB — LIPID PANEL
Cholesterol: 167 (ref 0–200)
HDL: 83 — AB (ref 35–70)
LDL Cholesterol: 63
Triglycerides: 78 (ref 40–160)

## 2019-03-15 LAB — TSH: TSH: 1.15 (ref 0.41–5.90)

## 2019-03-15 LAB — HEMOGLOBIN A1C: Hemoglobin A1C: 4.8

## 2019-03-25 ENCOUNTER — Encounter: Payer: Self-pay | Admitting: Physician Assistant

## 2019-04-19 LAB — LIPID PANEL
Cholesterol: 193 (ref 0–200)
HDL: 46 (ref 35–70)
LDL Cholesterol: 111
Triglycerides: 179 — AB (ref 40–160)

## 2019-04-19 LAB — BASIC METABOLIC PANEL: Glucose: 88

## 2019-04-19 LAB — HEMOGLOBIN A1C: Hemoglobin A1C: 5.1

## 2019-04-27 ENCOUNTER — Encounter: Payer: Self-pay | Admitting: Physician Assistant

## 2019-04-27 DIAGNOSIS — M5137 Other intervertebral disc degeneration, lumbosacral region: Secondary | ICD-10-CM

## 2019-04-27 MED ORDER — CYCLOBENZAPRINE HCL 10 MG PO TABS: 10.0000 mg | ORAL_TABLET | Freq: Three times a day (TID) | ORAL | 0 refills | Status: DC | PRN

## 2019-05-23 ENCOUNTER — Encounter: Payer: Self-pay | Admitting: Physician Assistant

## 2019-06-14 ENCOUNTER — Telehealth: Payer: PRIVATE HEALTH INSURANCE | Admitting: Emergency Medicine

## 2019-06-14 ENCOUNTER — Encounter: Payer: Self-pay | Admitting: Physician Assistant

## 2019-06-14 DIAGNOSIS — M549 Dorsalgia, unspecified: Secondary | ICD-10-CM

## 2019-06-14 DIAGNOSIS — M545 Low back pain: Secondary | ICD-10-CM | POA: Diagnosis not present

## 2019-06-14 MED ORDER — PREDNISONE 20 MG PO TABS: 40.0000 mg | ORAL_TABLET | Freq: Every day | ORAL | 0 refills | Status: DC

## 2019-06-14 MED ORDER — CYCLOBENZAPRINE HCL 10 MG PO TABS: 10.0000 mg | ORAL_TABLET | Freq: Three times a day (TID) | ORAL | 0 refills | Status: DC | PRN

## 2019-06-14 NOTE — Progress Notes (Signed)
We are sorry that you are not feeling well.  Here is how we plan to help!  Based on what you have shared with me it looks like you mostly have acute back pain.  Acute back pain is defined as musculoskeletal pain that can resolve in 1-3 weeks with conservative treatment.  I have prescribed Prednisone since it has worked in the past as well as Flexeril 10 mg every eight hours as needed which is a muscle relaxer  You can continue taking ibuprofen. Some patients experience stomach irritation or in increased heartburn with anti-inflammatory drugs.  Please keep in mind that muscle relaxer's can cause fatigue and should not be taken while at work or driving.  Back pain is very common.  The pain often gets better over time.  The cause of back pain is usually not dangerous.  Most people can learn to manage their back pain on their own.  Home Care  Stay active.  Start with short walks on flat ground if you can.  Try to walk farther each day.  Do not sit, drive or stand in one place for more than 30 minutes.  Do not stay in bed.  Do not avoid exercise or work.  Activity can help your back heal faster.  Be careful when you bend or lift an object.  Bend at your knees, keep the object close to you, and do not twist.  Sleep on a firm mattress.  Lie on your side, and bend your knees.  If you lie on your back, put a pillow under your knees.  Only take medicines as told by your doctor.  Put ice on the injured area.  Put ice in a plastic bag  Place a towel between your skin and the bag  Leave the ice on for 15-20 minutes, 3-4 times a day for the first 2-3 days. 210 After that, you can switch between ice and heat packs.  Ask your doctor about back exercises or massage.  Avoid feeling anxious or stressed.  Find good ways to deal with stress, such as exercise.  Get Help Right Way If:  Your pain does not go away with rest or medicine.  Your pain does not go away in 1 week.  You have new  problems.  You do not feel well.  The pain spreads into your legs.  You cannot control when you poop (bowel movement) or pee (urinate)  You feel sick to your stomach (nauseous) or throw up (vomit)  You have belly (abdominal) pain.  You feel like you may pass out (faint).  If you develop a fever.  Make Sure you:  Understand these instructions.  Will watch your condition  Will get help right away if you are not doing well or get worse.  Your e-visit answers were reviewed by a board certified advanced clinical practitioner to complete your personal care plan.  Depending on the condition, your plan could have included both over the counter or prescription medications.  If there is a problem please reply  once you have received a response from your provider.  Your safety is important to [place].  If you have drug allergies check your prescription carefully.    You can use MyChart to ask questions about today's visit, request a non-urgent call back, or ask for a work or school excuse for 24 hours related to this e-Visit. If it has been greater than 24 hours you will need to follow up with your provider, or enter a  new e-Visit to address those concerns.  You will get an e-mail in the next two days asking about your experience.  I hope that your e-visit has been valuable and will speed your recovery. Thank you for using e-visits.  Approximately 5 minutes was used in reviewing the patient's chart, questionnaire, prescribing medications, and documentation.

## 2019-06-14 NOTE — Telephone Encounter (Signed)
Routing to provider. Pt was seen in U/[facility] today.

## 2019-06-14 NOTE — Progress Notes (Signed)
I've sent an excuse note in myChart for a couple days.  If not feeling better by then, I'd make an in-person appointment.  Thanks.

## 2019-08-29 MED ORDER — CYCLOBENZAPRINE HCL 10 MG PO TABS: 10.0000 mg | ORAL_TABLET | Freq: Three times a day (TID) | ORAL | 0 refills | Status: DC | PRN

## 2019-09-09 ENCOUNTER — Other Ambulatory Visit: Payer: Self-pay

## 2019-09-09 ENCOUNTER — Ambulatory Visit: Payer: PRIVATE HEALTH INSURANCE | Admitting: Sports Medicine

## 2019-09-09 DIAGNOSIS — M545 Low back pain: Secondary | ICD-10-CM | POA: Diagnosis not present

## 2019-09-09 DIAGNOSIS — G8929 Other chronic pain: Secondary | ICD-10-CM | POA: Diagnosis not present

## 2019-09-09 DIAGNOSIS — K047 Periapical abscess without sinus: Secondary | ICD-10-CM | POA: Diagnosis not present

## 2019-09-09 MED ORDER — AMOXICILLIN-POT CLAVULANATE 875-125 MG PO TABS: 1.0000 | ORAL_TABLET | Freq: Two times a day (BID) | ORAL | 0 refills | Status: AC

## 2019-09-09 NOTE — Assessment & Plan Note (Signed)
It looks like he also has an abscess in his left mandibular molar #2. Adding Augmentin. Further follow-up of this needs to be with his dentist.

## 2019-09-09 NOTE — Progress Notes (Signed)
    Procedures performed today:    None.  Independent interpretation of notes and tests performed by another provider:   None.  Brief History, Exam, Impression, and Recommendations:    Chronic low back pain [name] returns, he has multilevel lumbar DDD, he gets flares approximately 8 times per year, generally a burst of prednisone helps, Flexeril also helps significantly. Today we filled out his FMLA paperwork to give him approximately a max of 40 hours/week and 4 episodes per 6 months of timeout. We can keep his Flexeril refilled and he can come see me on an as-needed basis.  ABSCESS, TOOTH It looks like he also has an abscess in his left mandibular molar #2. Adding Augmentin. Further follow-up of this needs to be with his dentist.    ___________________________________________ [name]. Benjamin Stain, M.D., ABFM., CAQSM. Primary Care and Sports Medicine [facility]  Adjunct Instructor of Family Medicine  [employer]

## 2019-09-09 NOTE — Assessment & Plan Note (Signed)
Thomas Noble returns, he has multilevel lumbar DDD, he gets flares approximately 8 times per year, generally a burst of prednisone helps, Flexeril also helps significantly. Today we filled out his FMLA paperwork to give him approximately a max of 40 hours/week and 4 episodes per 6 months of timeout. We can keep his Flexeril refilled and he can come see me on an as-needed basis.

## 2019-09-16 LAB — LIPID PANEL
Cholesterol: 150 (ref 0–200)
Cholesterol: 150 (ref 0–200)
HDL: 43 (ref 35–70)
HDL: 43 (ref 35–70)
LDL Cholesterol: 89
LDL Cholesterol: 89
Triglycerides: 88 (ref 40–160)
Triglycerides: 88 (ref 40–160)

## 2019-09-16 LAB — BASIC METABOLIC PANEL: Glucose: 94

## 2019-09-16 LAB — HEMOGLOBIN A1C: Hemoglobin A1C: 6.1

## 2019-09-16 LAB — BASIC METABOLIC PANEL WITH GFR: Glucose: 94

## 2019-10-14 ENCOUNTER — Other Ambulatory Visit: Payer: Self-pay

## 2019-10-14 MED ORDER — CYCLOBENZAPRINE HCL 10 MG PO TABS: 10.0000 mg | ORAL_TABLET | Freq: Three times a day (TID) | ORAL | 1 refills | Status: DC | PRN

## 2019-10-17 ENCOUNTER — Encounter (INDEPENDENT_AMBULATORY_CARE_PROVIDER_SITE_OTHER): Payer: PRIVATE HEALTH INSURANCE

## 2019-10-17 DIAGNOSIS — M545 Low back pain: Secondary | ICD-10-CM

## 2019-10-17 DIAGNOSIS — G8929 Other chronic pain: Secondary | ICD-10-CM

## 2019-10-17 MED ORDER — PREDNISONE 50 MG PO TABS: ORAL_TABLET | ORAL | 0 refills | Status: DC

## 2019-11-01 ENCOUNTER — Ambulatory Visit: Payer: PRIVATE HEALTH INSURANCE | Admitting: Sports Medicine

## 2019-11-01 ENCOUNTER — Other Ambulatory Visit: Payer: Self-pay

## 2019-11-01 DIAGNOSIS — M545 Low back pain, unspecified: Secondary | ICD-10-CM

## 2019-11-01 DIAGNOSIS — G8929 Other chronic pain: Secondary | ICD-10-CM | POA: Diagnosis not present

## 2019-11-01 NOTE — Assessment & Plan Note (Signed)
Thomas Noble is a pleasant 59 year old male, he has multilevel lumbar DDD with flares approximately 8 times per year, usually a burst of prednisone and some Flexeril 10 to help a lot. Today we filled out additional FMLA paperwork, this will be scanned into the chart and faxed off to his HR manager, return as needed for this.

## 2019-11-01 NOTE — Progress Notes (Signed)
    Procedures performed today:    None.  Independent interpretation of notes and tests performed by another provider:   None.  Brief History, Exam, Impression, and Recommendations:    Chronic low back pain Thomas Noble is a pleasant 59 year old male, he has multilevel lumbar DDD with flares approximately 8 times per year, usually a burst of prednisone and some Flexeril 10 to help a lot. Today we filled out additional FMLA paperwork, this will be scanned into the chart and faxed off to his HR manager, return as needed for this.    ___________________________________________ [name]. Benjamin Stain, M.D., ABFM., CAQSM. Primary Care and Sports Medicine [facility]  Adjunct Instructor of Family Medicine  [employer]

## 2019-11-12 ENCOUNTER — Encounter: Payer: Self-pay | Admitting: Physician Assistant

## 2019-11-14 ENCOUNTER — Telehealth: Payer: Self-pay | Admitting: Sports Medicine

## 2019-11-14 NOTE — Telephone Encounter (Signed)
FMLA papers filled out today. 

## 2019-11-16 ENCOUNTER — Encounter: Payer: Self-pay | Admitting: Physician Assistant

## 2019-11-16 ENCOUNTER — Ambulatory Visit (INDEPENDENT_AMBULATORY_CARE_PROVIDER_SITE_OTHER): Payer: PRIVATE HEALTH INSURANCE | Admitting: Physician Assistant

## 2019-11-16 VITALS — BP 152/81 | HR 85 | Ht 75.0 in | Wt 261.0 lb

## 2019-11-16 DIAGNOSIS — Z0000 Encounter for general adult medical examination without abnormal findings: Secondary | ICD-10-CM | POA: Diagnosis not present

## 2019-11-16 DIAGNOSIS — I10 Essential (primary) hypertension: Secondary | ICD-10-CM | POA: Diagnosis not present

## 2019-11-16 DIAGNOSIS — Z125 Encounter for screening for malignant neoplasm of prostate: Secondary | ICD-10-CM

## 2019-11-16 DIAGNOSIS — Z23 Encounter for immunization: Secondary | ICD-10-CM

## 2019-11-16 DIAGNOSIS — M1A9XX0 Chronic gout, unspecified, without tophus (tophi): Secondary | ICD-10-CM | POA: Diagnosis not present

## 2019-11-16 DIAGNOSIS — E7800 Pure hypercholesterolemia, unspecified: Secondary | ICD-10-CM | POA: Diagnosis not present

## 2019-11-16 MED ORDER — PRAVASTATIN SODIUM 80 MG PO TABS: ORAL_TABLET | ORAL | 3 refills | Status: DC

## 2019-11-16 MED ORDER — AMLODIPINE BESYLATE 10 MG PO TABS: ORAL_TABLET | ORAL | 1 refills | Status: DC

## 2019-11-16 MED ORDER — LISINOPRIL 10 MG PO TABS: 10.0000 mg | ORAL_TABLET | Freq: Every day | ORAL | 1 refills | Status: DC

## 2019-11-16 MED ORDER — ALLOPURINOL 300 MG PO TABS: 300.0000 mg | ORAL_TABLET | Freq: Every day | ORAL | 3 refills | Status: DC

## 2019-11-16 NOTE — Progress Notes (Addendum)
Subjective:    Patient ID: Thomas Noble, male    DOB: 08-05-1960, 59 y.o.   MRN: [id number]  HPI 59 year old male with gout, HTN, HLD who presents to clinic for annual physical.   Admits forgetting BP meds this morning but did take them right before appt.  Diet can use some improvement. 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 On his feet all day and moving around for work. States that his back hurts daily but is manageable 5/10 pain. States that he has been having allergies for and taking allegra for it. No CP, palpitations, headaches, dizziness, or vision changes.   .. Active Ambulatory Problems    Diagnosis Date Noted  . Gout 11/22/2008  . DEPRESSION 11/22/2008  . ESSENTIAL HYPERTENSION, BENIGN 11/22/2008  . DISC DISEASE, CERVICAL 11/21/2008  . DISC DISEASE, LUMBAR 11/21/2008  . OSTEOPENIA 11/22/2008  . ALCOHOL ABUSE, HX OF 11/22/2008  . Chronic low back pain 06/14/2014  . Elevated fasting glucose 08/26/2015  . Pure hypercholesterolemia 06/20/2016   Resolved Ambulatory Problems    Diagnosis Date Noted  . HLD (hyperlipidemia) 11/22/2008  . ACUTE NASOPHARYNGITIS 12/18/2009  . ABSCESS, TOOTH 01/30/2010  . GASTROENTERITIS WITHOUT DEHYDRATION 01/31/2009  . Muscle spasm of back 06/14/2014  . DDD (degenerative disc disease), lumbar 06/14/2014  . Left-sided low back pain without sciatica 02/12/2015  . Viral URI with cough 05/31/2015   No Additional Past Medical History   .Marland KitchenHistory reviewed. No pertinent family history.   Review of Systems  Constitutional: Negative for chills, fatigue and fever.  Cardiovascular: Negative for chest pain and palpitations.       Objective:   Physical Exam Constitutional:      General: He is not in acute distress.    Appearance: Normal appearance.  HENT:     Head: Normocephalic and atraumatic.     Right Ear: Tympanic membrane and ear canal normal.     Left Ear: Tympanic membrane and ear canal normal.     Nose: Nose normal.   Eyes:     Extraocular Movements: Extraocular movements intact.     Pupils: Pupils are equal, round, and reactive to light.  Neck:     Vascular: No carotid bruit.  Cardiovascular:     Rate and Rhythm: Normal rate and regular rhythm.     Heart sounds: Normal heart sounds.  Pulmonary:     Effort: Pulmonary effort is normal.     Breath sounds: Normal breath sounds.  Abdominal:     General: Bowel sounds are normal.     Palpations: Abdomen is soft.     Tenderness: There is no right CVA tenderness or left CVA tenderness.  Musculoskeletal:        General: Normal range of motion.     Cervical back: Neck supple. No tenderness.  Skin:    General: Skin is warm and dry.     Capillary Refill: Capillary refill takes less than 2 seconds.  Neurological:     Mental Status: He is alert and oriented to person, place, and time.  Psychiatric:        Mood and Affect: Mood normal.        Behavior: Behavior normal.    ..  RO-AUA SYMPTOM    Row Name 11/16/19 1000         During the last Month   Sensation of Bladder not Empty Not at all     Urinate<2 hours after last Not at all  Mult. stop/start when voiding Not at all     Difficult to postpone voiding Less than 1 time in 5     Weak urinary stream Not at all     Push/strain to begin urination Not at all     Times per night up to urinate Less than 1 time in 5       OTHER   Total Score 2           .. Depression screen [facility] 2/9 11/16/2019 04/13/2018 03/17/2017 06/20/2016  Decreased Interest 0 0 0 0  Down, Depressed, Hopeless 0 0 0 0  PHQ - 2 Score 0 0 0 0  Altered sleeping - 0 - -  Tired, decreased energy - 0 - -  Change in appetite - 0 - -  Feeling bad or failure about yourself  - 0 - -  Trouble concentrating - 0 - -  Moving slowly or fidgety/restless - 0 - -  Suicidal thoughts - 0 - -  PHQ-9 Score - 0 - -  Difficult doing work/chores - Not difficult at all - -   .Marland KitchenNo flowsheet data found.          Assessment & Plan:   .Marland KitchenJaylend was seen today for annual exam.  Diagnoses and all orders for this visit:  Routine physical examination -     COMPLETE METABOLIC PANEL WITH GFR -     Uric acid -     PSA -     CBC with Differential/Platelet  Pure hypercholesterolemia -     pravastatin (PRAVACHOL) 80 MG tablet; TAKE 1 TABLET (80 MG TOTAL) BY MOUTH DAILY.  Chronic gout without tophus, unspecified cause, unspecified site -     allopurinol (ZYLOPRIM) 300 MG tablet; Take 1 tablet (300 mg total) by mouth daily. -     COMPLETE METABOLIC PANEL WITH GFR -     Uric acid  Essential hypertension, benign -     amLODipine (NORVASC) 10 MG tablet; TAKE 1 TABLET (10 MG TOTAL) BY MOUTH DAILY. -     COMPLETE METABOLIC PANEL WITH GFR -     lisinopril (ZESTRIL) 10 MG tablet; Take 1 tablet (10 mg total) by mouth daily.  Prostate cancer screening -     PSA  Need for shingles vaccine -     Varicella-zoster vaccine IM   .[name] Discussed 150 minutes of exercise a week.  Encouraged vitamin D 1000 units and Calcium 1300mg  or 4 servings of dairy a day.  Fasting labs ordered.  Pt declines colonoscopy. He is aware of risk.  Pt declines flu shot. He states he will get at work.  He does have covid vaccine completed.  He did get first shingrx today. Follow up in 2-6 months.  AUA is 2. PSA ordered.  phq and GAD stable.   BP is not controlled. Continue norvasc and start lisinopril. Discussed side effects. Consider getting BP cuff and keeping log. Report back in 1 month. Discussed low salt diet. Pt has been sober from alcohol for 13 years. Pt chews. Declined wanting any cessation. Follow up in 6 months.   [name] PA-C, have reviewed and agree with the above documentation in it's entirety.

## 2019-11-16 NOTE — Patient Instructions (Signed)

## 2019-11-17 ENCOUNTER — Encounter: Payer: Self-pay | Admitting: Physician Assistant

## 2019-11-17 LAB — URIC ACID: Uric Acid, Serum: 4.9 mg/dL (ref 4.0–8.0)

## 2019-11-17 LAB — CBC WITH DIFFERENTIAL/PLATELET
Absolute Monocytes: 731 cells/uL (ref 200–950)
Basophils Absolute: 94 cells/uL (ref 0–200)
Basophils Relative: 1.1 %
Eosinophils Absolute: 425 cells/uL (ref 15–500)
Eosinophils Relative: 5 %
HCT: 44.8 % (ref 38.5–50.0)
Hemoglobin: 15.4 g/dL (ref 13.2–17.1)
Lymphs Abs: 2270 cells/uL (ref 850–3900)
MCH: 31.5 pg (ref 27.0–33.0)
MCHC: 34.4 g/dL (ref 32.0–36.0)
MCV: 91.6 fL (ref 80.0–100.0)
MPV: 11.4 fL (ref 7.5–12.5)
Monocytes Relative: 8.6 %
Neutro Abs: 4981 cells/uL (ref 1500–7800)
Neutrophils Relative %: 58.6 %
Platelets: 254 10*3/uL (ref 140–400)
RBC: 4.89 10*6/uL (ref 4.20–5.80)
RDW: 12.8 % (ref 11.0–15.0)
Total Lymphocyte: 26.7 %
WBC: 8.5 10*3/uL (ref 3.8–10.8)

## 2019-11-17 LAB — COMPLETE METABOLIC PANEL WITH GFR
AG Ratio: 2 (calc) (ref 1.0–2.5)
ALT: 26 U/L (ref 9–46)
AST: 17 U/L (ref 10–35)
Albumin: 4.9 g/dL (ref 3.6–5.1)
Alkaline phosphatase (APISO): 111 U/L (ref 35–144)
BUN: 12 mg/dL (ref 7–25)
CO2: 27 mmol/L (ref 20–32)
Calcium: 9.8 mg/dL (ref 8.6–10.3)
Chloride: 102 mmol/L (ref 98–110)
Creat: 0.93 mg/dL (ref 0.70–1.25)
GFR, Est African American: 103 mL/min/{1.73_m2} (ref >=60)
GFR, Est Non African American: 89 mL/min/{1.73_m2} (ref >=60)
Globulin: 2.4 g/dL (calc) (ref 1.9–3.7)
Glucose, Bld: 109 mg/dL — ABNORMAL HIGH (ref 65–99)
Potassium: 4.3 mmol/L (ref 3.5–5.3)
Sodium: 138 mmol/L (ref 135–146)
Total Bilirubin: 0.5 mg/dL (ref 0.2–1.2)
Total Protein: 7.3 g/dL (ref 6.1–8.1)

## 2019-11-17 LAB — PSA: PSA: 1.63 ng/mL (ref <=4.0)

## 2019-11-17 NOTE — Telephone Encounter (Signed)
Received vm with same information. Please advise next steps.

## 2019-11-17 NOTE — Progress Notes (Signed)
Thomas Noble,   Overall labs look really good. Was patient fasting? I don't think he was but if he was need to add A!C.

## 2019-11-22 LAB — LIPID PANEL
Cholesterol: 150 (ref 0–200)
HDL: 43 (ref 35–70)
LDL Cholesterol: 89
Triglycerides: 88 (ref 40–160)

## 2019-11-22 LAB — HEMOGLOBIN A1C: Hemoglobin A1C: 6.1

## 2019-11-22 LAB — BASIC METABOLIC PANEL: Glucose: 94

## 2019-12-02 ENCOUNTER — Encounter: Payer: Self-pay | Admitting: Neurology

## 2019-12-23 DIAGNOSIS — G8929 Other chronic pain: Secondary | ICD-10-CM

## 2019-12-23 DIAGNOSIS — M545 Low back pain, unspecified: Secondary | ICD-10-CM

## 2019-12-23 MED ORDER — PREDNISONE 50 MG PO TABS: 50.0000 mg | ORAL_TABLET | Freq: Every day | ORAL | 0 refills | Status: DC

## 2019-12-23 NOTE — Telephone Encounter (Signed)
Prednisone sent in.  I spent 5 total minutes of online digital evaluation and management services.

## 2019-12-26 ENCOUNTER — Encounter: Payer: Self-pay | Admitting: Physician Assistant

## 2020-02-11 MED ORDER — CYCLOBENZAPRINE HCL 10 MG PO TABS: 10.0000 mg | ORAL_TABLET | Freq: Three times a day (TID) | ORAL | 1 refills | Status: DC | PRN

## 2020-02-14 ENCOUNTER — Telehealth: Payer: PRIVATE HEALTH INSURANCE | Admitting: Physician Assistant

## 2020-02-14 DIAGNOSIS — J111 Influenza due to unidentified influenza virus with other respiratory manifestations: Secondary | ICD-10-CM

## 2020-02-14 MED ORDER — FLUTICASONE PROPIONATE 50 MCG/ACT NA SUSP: 2.0000 | Freq: Every day | NASAL | 0 refills | Status: DC

## 2020-02-14 MED ORDER — BENZONATATE 100 MG PO CAPS: 100.0000 mg | ORAL_CAPSULE | Freq: Three times a day (TID) | ORAL | 0 refills | Status: DC | PRN

## 2020-02-14 NOTE — Progress Notes (Signed)
E-Visit for Corona Virus Screening  Your current symptoms could be consistent with the coronavirus or may be influenza.  We recommend testing, but symptomatic treatments remain the same. (If you test positive for COVID - you may qualify for monoclonal antibodies.)  Many health care providers can now test patients at their office but not all are.  [facility] has multiple testing sites. For information on our COVID testing locations and hours go to [email or web address]  We are enrolling you in our MyChart Home Monitoring for COVID19 . Daily you will receive a questionnaire within the MyChart website. Our COVID 19 response team will be monitoring your responses daily.  Testing Information: The COVID-19 Community Testing sites are testing BY APPOINTMENT ONLY.  You can schedule online at [email or web address]  If you do not have access to a smart phone or computer you may call [phone] for an appointment.   Additional testing sites in the Community:  . For [facility] Testing sites in [facility]  FarmerBuys.[email or web address]  . For Pop-up testing sites in [place]  [email or web address]  . For [facility] [email or web address].dk  . For [facility] testing in [place] and [employer] [email or web address]  . For [facility] testing in [facility]   [email or web address]  For  more information about community testing call [phone]   Please quarantine yourself while awaiting your test results. Please stay home for a minimum of 10 days from the first day of illness with improving symptoms and you have had 24 hours of no fever (without the use of Tylenol (Acetaminophen) Motrin  (Ibuprofen) or any fever reducing medication).  Also - Do not get tested prior to returning to work because once you have had a positive test the test can stay positive for more than a month in some cases.   You should wear a mask or cloth face covering over your nose and mouth if you must be around other people or animals, including pets (even at home). Try to stay at least 6 feet away from other people. This will protect the people around you.  Please continue good preventive care measures, including:  frequent hand-washing, avoid touching your face, cover coughs/sneezes, stay out of crowds and keep a 6 foot distance from others.  COVID-19 is a respiratory illness with symptoms that are similar to the flu. Symptoms are typically mild to moderate, but there have been cases of severe illness and death due to the virus.   The following symptoms may appear 2-14 days after exposure: . Fever . Cough . Shortness of breath or difficulty breathing . Chills . Repeated shaking with chills . Muscle pain . Headache . Sore throat . New loss of taste or smell . Fatigue . Congestion or runny nose . Nausea or vomiting . Diarrhea  Go to the nearest hospital ED for assessment if fever/cough/breathlessness are severe or illness seems like a threat to life.  It is vitally important that if you feel that you have an infection such as this virus or any other virus that you stay home and away from places where you may spread it to others.  You should avoid contact with people age 74 and older.   You can use medication such as A prescription cough medication called Tessalon Perles 100 mg. You may take 1-2 capsules every 8 hours as needed for cough and A prescription for Fluticasone nasal spray 2 sprays in each nostril one time per day  You may also take acetaminophen (Tylenol) as needed for fever.  Reduce your risk of any infection by using the same precautions used for avoiding the common cold or flu:  [name] Wash  your hands often with soap and warm water for at least 20 seconds.  If soap and water are not readily available, use an alcohol-based hand sanitizer with at least 60% alcohol.  . If coughing or sneezing, cover your mouth and nose by coughing or sneezing into the elbow areas of your shirt or coat, into a tissue or into your sleeve (not your hands). . Avoid shaking hands with others and consider head nods or verbal greetings only. . Avoid touching your eyes, nose, or mouth with unwashed hands.  . Avoid close contact with people who are sick. . Avoid places or events with large numbers of people in one location, like concerts or sporting events. . Carefully consider travel plans you have or are making. . If you are planning any travel outside or inside the [place], visit the CDC's Travelers' Health webpage for the latest health notices. . If you have some symptoms but not all symptoms, continue to monitor at home and seek medical attention if your symptoms worsen. . If you are having a medical emergency, call 911.  HOME CARE . Only take medications as instructed by your medical team. . Drink plenty of fluids and get plenty of rest. . A steam or ultrasonic humidifier can help if you have congestion.   GET HELP RIGHT AWAY IF YOU HAVE EMERGENCY WARNING SIGNS** FOR COVID-19. If you or someone is showing any of these signs seek emergency medical care immediately. Call 911 or proceed to your closest emergency facility if: . You develop worsening high fever. . Trouble breathing . Bluish lips or face . Persistent pain or pressure in the chest . New confusion . Inability to wake or stay awake . You cough up blood. . Your symptoms become more severe  **This list is not all possible symptoms. Contact your medical provider for any symptoms that are sever or concerning to you.  MAKE SURE YOU   Understand these instructions.  Will watch your condition.  Will get help right away if you are not doing well  or get worse.  Your e-visit answers were reviewed by a board certified advanced clinical practitioner to complete your personal care plan.  Depending on the condition, your plan could have included both over the counter or prescription medications.  If there is a problem please reply once you have received a response from your provider.  Your safety is important to [place].  If you have drug allergies check your prescription carefully.    You can use MyChart to ask questions about today's visit, request a non-urgent call back, or ask for a work or school excuse for 24 hours related to this e-Visit. If it has been greater than 24 hours you will need to follow up with your provider, or enter a new e-Visit to address those concerns. You will get an e-mail in the next two days asking about your experience.  I hope that your e-visit has been valuable and will speed your recovery. Thank you for using e-visits.   Greater than 5 minutes, yet less than 10 minutes of time have been spent researching, coordinating, and implementing care for this patient today

## 2020-02-17 ENCOUNTER — Encounter: Payer: Self-pay | Admitting: Physician Assistant

## 2020-04-09 ENCOUNTER — Other Ambulatory Visit: Payer: Self-pay

## 2020-04-09 DIAGNOSIS — M545 Low back pain, unspecified: Secondary | ICD-10-CM | POA: Diagnosis not present

## 2020-04-09 DIAGNOSIS — G8929 Other chronic pain: Secondary | ICD-10-CM | POA: Diagnosis not present

## 2020-04-10 MED ORDER — PREDNISONE 50 MG PO TABS: 50.0000 mg | ORAL_TABLET | Freq: Every day | ORAL | 0 refills | Status: DC

## 2020-04-10 NOTE — Assessment & Plan Note (Signed)
Thomas Noble gets occasional flares in his back pain, a few times a year, approximately 8, typically a burst of prednisone and some Flexeril 10 help a lot, he last had some pain back in August of last year, I am sending in another burst of prednisone.  I spent 5 total minutes of online digital evaluation and management services.

## 2020-04-10 NOTE — Telephone Encounter (Signed)
I spent 5 total minutes of online digital evaluation and management services. 

## 2020-05-04 ENCOUNTER — Ambulatory Visit: Payer: PRIVATE HEALTH INSURANCE | Admitting: Sports Medicine

## 2020-05-08 ENCOUNTER — Encounter: Payer: Self-pay | Admitting: Sports Medicine

## 2020-05-08 ENCOUNTER — Ambulatory Visit (INDEPENDENT_AMBULATORY_CARE_PROVIDER_SITE_OTHER): Payer: PRIVATE HEALTH INSURANCE

## 2020-05-08 ENCOUNTER — Other Ambulatory Visit: Payer: Self-pay

## 2020-05-08 ENCOUNTER — Ambulatory Visit: Payer: PRIVATE HEALTH INSURANCE | Admitting: Sports Medicine

## 2020-05-08 DIAGNOSIS — M47816 Spondylosis without myelopathy or radiculopathy, lumbar region: Secondary | ICD-10-CM | POA: Diagnosis not present

## 2020-05-08 DIAGNOSIS — M545 Low back pain, unspecified: Secondary | ICD-10-CM | POA: Diagnosis not present

## 2020-05-08 MED ORDER — CELECOXIB 200 MG PO CAPS: ORAL_CAPSULE | ORAL | 2 refills | Status: DC

## 2020-05-08 NOTE — Assessment & Plan Note (Signed)
Thomas Noble is a pleasant 60 year old male, he has chronic multilevel lumbar spondylosis, we occasionally give him a burst of prednisone. We are going to change the treatment course for now, getting updated x-rays, he will continue his home rehab exercises, printing out a new handout. Adding Celebrex to be used daily. Return to see me as needed.

## 2020-05-08 NOTE — Progress Notes (Signed)
    Procedures performed today:    None.  Independent interpretation of notes and tests performed by another provider:   None.  Brief History, Exam, Impression, and Recommendations:    Lumbar spondylosis Thomas Noble is a pleasant 60 year old male, he has chronic multilevel lumbar spondylosis, we occasionally give him a burst of prednisone. We are going to change the treatment course for now, getting updated x-rays, he will continue his home rehab exercises, printing out a new handout. Adding Celebrex to be used daily. Return to see me as needed.    ___________________________________________ [name]. Benjamin Stain, M.D., ABFM., CAQSM. Primary Care and Sports Medicine [facility]  Adjunct Instructor of Family Medicine  [employer]

## 2020-05-22 ENCOUNTER — Encounter: Payer: Self-pay | Admitting: Physician Assistant

## 2020-06-05 LAB — HEMOGLOBIN A1C: Hemoglobin A1C: 6.1

## 2020-06-20 ENCOUNTER — Encounter: Payer: Self-pay | Admitting: Physician Assistant

## 2020-07-12 ENCOUNTER — Telehealth: Payer: Self-pay | Admitting: Neurology

## 2020-07-12 NOTE — Telephone Encounter (Signed)
LVM for patient to call back to get 6 month diabetic follow up scheduled. AM

## 2020-07-12 NOTE — Telephone Encounter (Signed)
Needs 6 month follow up. Diabetic. Please call patient to schedule.

## 2020-07-17 LAB — LIPID PANEL
Cholesterol: 158 (ref 0–200)
HDL: 41 (ref 35–70)
LDL Cholesterol: 89
Triglycerides: 137 (ref 40–160)

## 2020-07-17 LAB — HEMOGLOBIN A1C: Hemoglobin A1C: 5.7

## 2020-07-17 LAB — BASIC METABOLIC PANEL: Glucose: 108

## 2020-07-20 ENCOUNTER — Encounter: Payer: Self-pay | Admitting: Physician Assistant

## 2020-07-28 ENCOUNTER — Other Ambulatory Visit: Payer: Self-pay | Admitting: Sports Medicine

## 2020-07-28 DIAGNOSIS — M47816 Spondylosis without myelopathy or radiculopathy, lumbar region: Secondary | ICD-10-CM

## 2020-08-15 ENCOUNTER — Other Ambulatory Visit: Payer: Self-pay | Admitting: Physician Assistant

## 2020-08-15 DIAGNOSIS — I10 Essential (primary) hypertension: Secondary | ICD-10-CM

## 2020-09-23 ENCOUNTER — Emergency Department
Admission: EM | Admit: 2020-09-23 | Discharge: 2020-09-23 | Disposition: A | Discharge status: Home / Self Care | Payer: PRIVATE HEALTH INSURANCE | Source: Home / Self Care | Attending: Emergency Medicine | Admitting: Emergency Medicine

## 2020-09-23 ENCOUNTER — Other Ambulatory Visit: Payer: Self-pay

## 2020-09-23 DIAGNOSIS — L089 Local infection of the skin and subcutaneous tissue, unspecified: Secondary | ICD-10-CM

## 2020-09-23 DIAGNOSIS — L723 Sebaceous cyst: Secondary | ICD-10-CM | POA: Diagnosis not present

## 2020-09-23 MED ORDER — DOXYCYCLINE HYCLATE 100 MG PO CAPS: 100.0000 mg | ORAL_CAPSULE | Freq: Two times a day (BID) | ORAL | 0 refills | Status: AC

## 2020-09-23 MED ORDER — HIBICLENS 4 % EX LIQD: Freq: Every day | CUTANEOUS | 0 refills | Status: DC | PRN

## 2020-09-23 NOTE — ED Triage Notes (Signed)
Pt see in UC w/ c/o elbow drainage, bruising, and swelling in his left elbow and arm. Pt states he has been diagnosed with having fatty tissue at his elbow that has worsened since accidentally hitting his elbow. Pt states he has been using witch hazel, peroxide, and OTC abx cream.

## 2020-09-23 NOTE — Discharge Instructions (Addendum)
This appears to be an infected epithelial inclusion/sebaceous cyst.  I am sending you home with doxycycline, finish that you feel better.  Keep this clean with Hibiclens and water.  Warm compresses.  He may want to have this removed when it is no longer infected.  Follow-up with your doctor for referral to dermatology or surgery.

## 2020-09-23 NOTE — ED Provider Notes (Signed)
HPI  SUBJECTIVE:  Thomas Noble is a 60 y.o. male who presents with a mildly painful mass on his medial left forearm that he describes as "soreness".  States that he has had "fatty tissue" in this area for years.  He hit it several weeks ago, and sustained a bruise.  It started draining yesterday.  He was able to drain blood, pus and thick white material.  It decreased in size.  No known insect bites, fevers, body aches, erythema radiating up his arm.  He reports some distal swelling, thinks that was because he was wearing a tight bandage on it.  He has tried peroxide, which hazel and antibiotic ointment, and a bandage twice starting yesterday.  No aggravating or alleviating factors.  He has a past medical history of gout, hypercholesterolemia, hypertension.  No history of MRSA.  MCN:[id number], Lonna Cobb, PA-C   Past Medical History:  Diagnosis Date   Essential hypertension, benign 11/22/2008   Qualifier: Diagnosis of  By: [name]     Gout 11/22/2008   Qualifier: Diagnosis of  By: Cathey Endow DO, Karen     HLD (hyperlipidemia) 11/22/2008   Qualifier: Diagnosis of  By: [name]      History reviewed. No pertinent surgical history.  History reviewed. No pertinent family history.  Social History   Tobacco Use   Smoking status: Never   Smokeless tobacco: Current    Types: Snuff  Vaping Use   Vaping Use: Never used  Substance Use Topics   Alcohol use: Not Currently    No current facility-administered medications for this encounter.  Current Outpatient Medications:    chlorhexidine (HIBICLENS) 4 % external liquid, Apply topically daily as needed. Dilute 10-15 mL in water, Use daily when bathing for 1-2 weeks, Disp: 120 mL, Rfl: 0   doxycycline (VIBRAMYCIN) 100 MG capsule, Take 1 capsule (100 mg total) by mouth 2 (two) times daily for 7 days., Disp: 14 capsule, Rfl: 0   allopurinol (ZYLOPRIM) 300 MG tablet, Take 1 tablet (300 mg total) by mouth daily., Disp: 90 tablet, Rfl: 3    amLODipine (NORVASC) 10 MG tablet, TAKE 1 TABLET(10 MG) BY MOUTH DAILY/ appt for further refills, Disp: 90 tablet, Rfl: 0   celecoxib (CELEBREX) 200 MG capsule, TAKE ONE TO TWO CAPSULES BY MOUTH EVERY DAY AS NEEDED FOR PAIN, Disp: 60 capsule, Rfl: 2   lisinopril (ZESTRIL) 10 MG tablet, Take 1 tablet (10 mg total) by mouth daily. (Patient not taking: Reported on 05/08/2020), Disp: 90 tablet, Rfl: 1   pravastatin (PRAVACHOL) 80 MG tablet, TAKE 1 TABLET (80 MG TOTAL) BY MOUTH DAILY., Disp: 90 tablet, Rfl: 3  No Known Allergies   ROS  As noted in HPI.   Physical Exam  BP (!) 179/92 (BP Location: Right Arm)   Pulse 84   Temp 98.2 F (36.8 C) (Oral)   Resp 14   Ht 6\' 3"  (1.905 m)   Wt 120.2 kg   SpO2 99%   BMI 33.12 kg/m   Constitutional: Well developed, well nourished, no acute distress Eyes:  EOMI, conjunctiva normal bilaterally HENT: Normocephalic, atraumatic,mucus membranes moist Respiratory: Normal inspiratory effort Cardiovascular: Normal rate GI: nondistended skin: 6 x 4.5 minimally tender area of erythema, induration with central draining area medial left elbow.  Positive expressible serosanguineous purulent material.  Also able to express thick white, cheesy white material.  Marked area of induration with a marker for reference.    Musculoskeletal: no deformities Neurologic: Alert & oriented x 3,  no focal neuro deficits Psychiatric: Speech and behavior appropriate   ED Course   Medications - No data to display  Orders Placed This Encounter  Procedures   WOUND CULTURE    Standing Status:   Standing    Number of Occurrences:   1     No results found for this or any previous visit (from the past 24 hour(s)). No results found.  ED Clinical Impression  1. Infected sebaceous cyst      ED Assessment/Plan  Presentation consistent with an infected epithelial inclusion cyst medial left elbow.  It is minimally tender to palpation.  It is already draining, do not  think that we need to extend the hole.  Sending off wound culture to confirm antibiotic choice.  Home with 7 days of doxycycline, Hibiclens, warm compresses.  Follow-up with PMD for referral to surgery or dermatology when this gets better for definitive removal if it continues to bother him.  ER return precautions given.  Discussed medical decision making, treatment plan and plan for follow-up with patient.  He agrees with plan.   Meds ordered this encounter  Medications   chlorhexidine (HIBICLENS) 4 % external liquid    Sig: Apply topically daily as needed. Dilute 10-15 mL in water, Use daily when bathing for 1-2 weeks    Dispense:  120 mL    Refill:  0   doxycycline (VIBRAMYCIN) 100 MG capsule    Sig: Take 1 capsule (100 mg total) by mouth 2 (two) times daily for 7 days.    Dispense:  14 capsule    Refill:  0       *This clinic note was created using Scientist, clinical (histocompatibility and immunogenetics). Therefore, there may be occasional mistakes despite careful proofreading.  ?    Domenick Gong, MD 09/23/20 [phone]

## 2020-09-26 LAB — WOUND CULTURE
MICRO NUMBER:: 12157465
SPECIMEN QUALITY:: ADEQUATE

## 2020-10-05 ENCOUNTER — Ambulatory Visit (INDEPENDENT_AMBULATORY_CARE_PROVIDER_SITE_OTHER): Payer: No Typology Code available for payment source | Admitting: Physician Assistant

## 2020-10-05 ENCOUNTER — Encounter: Payer: Self-pay | Admitting: Physician Assistant

## 2020-10-05 ENCOUNTER — Other Ambulatory Visit: Payer: Self-pay

## 2020-10-05 VITALS — BP 135/78 | HR 75 | Temp 98.4°F | Ht 73.5 in | Wt 270.8 lb

## 2020-10-05 DIAGNOSIS — R21 Rash and other nonspecific skin eruption: Secondary | ICD-10-CM

## 2020-10-05 DIAGNOSIS — I10 Essential (primary) hypertension: Secondary | ICD-10-CM | POA: Diagnosis not present

## 2020-10-05 DIAGNOSIS — Z23 Encounter for immunization: Secondary | ICD-10-CM | POA: Diagnosis not present

## 2020-10-05 DIAGNOSIS — E7800 Pure hypercholesterolemia, unspecified: Secondary | ICD-10-CM

## 2020-10-05 DIAGNOSIS — L723 Sebaceous cyst: Secondary | ICD-10-CM

## 2020-10-05 DIAGNOSIS — B351 Tinea unguium: Secondary | ICD-10-CM

## 2020-10-05 DIAGNOSIS — M1A9XX0 Chronic gout, unspecified, without tophus (tophi): Secondary | ICD-10-CM

## 2020-10-05 DIAGNOSIS — Z0000 Encounter for general adult medical examination without abnormal findings: Secondary | ICD-10-CM | POA: Diagnosis not present

## 2020-10-05 MED ORDER — AMLODIPINE BESYLATE 10 MG PO TABS: ORAL_TABLET | ORAL | 3 refills | Status: DC

## 2020-10-05 MED ORDER — PRAVASTATIN SODIUM 80 MG PO TABS: ORAL_TABLET | ORAL | 3 refills | Status: DC

## 2020-10-05 MED ORDER — TERBINAFINE HCL 250 MG PO TABS: 250.0000 mg | ORAL_TABLET | Freq: Every day | ORAL | 1 refills | Status: AC

## 2020-10-05 MED ORDER — ALLOPURINOL 300 MG PO TABS: 300.0000 mg | ORAL_TABLET | Freq: Every day | ORAL | 3 refills | Status: DC

## 2020-10-05 NOTE — Patient Instructions (Addendum)
Health Maintenance, Male Adopting a healthy lifestyle and getting preventive care are important in promoting health and wellness. Ask your health care provider about: The right schedule for you to have regular tests and exams. Things you can do on your own to prevent diseases and keep yourself healthy. What should I know about diet, weight, and exercise? Eat a healthy diet  Eat a diet that includes plenty of vegetables, fruits, low-fat dairy products, and lean protein. Do not eat a lot of foods that are high in solid fats, added sugars, or sodium.  Maintain a healthy weight Body mass index (BMI) is a measurement that can be used to identify possible weight problems. It estimates body fat based on height and weight. Your health care provider can help determine your BMI and help you achieve or maintain ahealthy weight. Get regular exercise Get regular exercise. This is one of the most important things you can do for your health. Most adults should: Exercise for at least 150 minutes each week. The exercise should increase your heart rate and make you sweat (moderate-intensity exercise). Do strengthening exercises at least twice a week. This is in addition to the moderate-intensity exercise. Spend less time sitting. Even light physical activity can be beneficial. Watch cholesterol and blood lipids Have your blood tested for lipids and cholesterol at 61 years of age, then havethis test every 5 years. You may need to have your cholesterol levels checked more often if: Your lipid or cholesterol levels are high. You are older than 61 years of age. You are at high risk for heart disease. What should I know about cancer screening? Many types of cancers can be detected early and may often be prevented. Depending on your health history and family history, you may need to have cancer screening at various ages. This may include screening for: Colorectal cancer. Prostate cancer. Skin cancer. Lung  cancer. What should I know about heart disease, diabetes, and high blood pressure? Blood pressure and heart disease High blood pressure causes heart disease and increases the risk of stroke. This is more likely to develop in people who have high blood pressure readings, are of African descent, or are overweight. Talk with your health care provider about your target blood pressure readings. Have your blood pressure checked: Every 3-5 years if you are 18-39 years of age. Every year if you are 40 years old or older. If you are between the ages of 65 and 75 and are a current or former smoker, ask your health care provider if you should have a one-time screening for abdominal aortic aneurysm (AAA). Diabetes Have regular diabetes screenings. This checks your fasting blood sugar level. Have the screening done: Once every three years after age 45 if you are at a normal weight and have a low risk for diabetes. More often and at a younger age if you are overweight or have a high risk for diabetes. What should I know about preventing infection? Hepatitis B If you have a higher risk for hepatitis B, you should be screened for this virus. Talk with your health care provider to find out if you are at risk forhepatitis B infection. Hepatitis C Blood testing is recommended for: Everyone born from 1945 through 1965. Anyone with known risk factors for hepatitis C. Sexually transmitted infections (STIs) You should be screened each year for STIs, including gonorrhea and chlamydia, if: You are sexually active and are younger than 61 years of age. You are older than 61 years of age   and your health care provider tells you that you are at risk for this type of infection. Your sexual activity has changed since you were last screened, and you are at increased risk for chlamydia or gonorrhea. Ask your health care provider if you are at risk. Ask your health care provider about whether you are at high risk for HIV.  Your health care provider may recommend a prescription medicine to help prevent HIV infection. If you choose to take medicine to prevent HIV, you should first get tested for HIV. You should then be tested every 3 months for as long as you are taking the medicine. Follow these instructions at home: Lifestyle Do not use any products that contain nicotine or tobacco, such as cigarettes, e-cigarettes, and chewing tobacco. If you need help quitting, ask your health care provider. Do not use street drugs. Do not share needles. Ask your health care provider for help if you need support or information about quitting drugs. Alcohol use Do not drink alcohol if your health care provider tells you not to drink. If you drink alcohol: Limit how much you have to 0-2 drinks a day. Be aware of how much alcohol is in your drink. In the [place] one drink equals one 12 oz bottle of beer (355 mL), one 5 oz glass of wine (148 mL), or one 1 oz glass of hard liquor (44 mL). General instructions Schedule regular health, dental, and eye exams. Stay current with your vaccines. Tell your health care provider if: You often feel depressed. You have ever been abused or do not feel safe at home. Summary Adopting a healthy lifestyle and getting preventive care are important in promoting health and wellness. Follow your health care provider's instructions about healthy diet, exercising, and getting tested or screened for diseases. Follow your health care provider's instructions on monitoring your cholesterol and blood pressure. This information is not intended to replace advice given to you by your health care provider. Make sure you discuss any questions you have with your healthcare provider. Document Revised: 02/10/2018 Document Reviewed: 02/10/2018 Elsevier Patient Education  2022 Elsevier Inc.   Recombinant Zoster (Shingles) Vaccine: What You Need to Know 1. Why get vaccinated? Recombinant zoster (shingles) vaccine  can prevent shingles. Shingles (also called herpes zoster, or just zoster) is a painful skin rash, usually with blisters. In addition to the rash, shingles can cause fever, headache, chills, or upset stomach. More rarely, shingles can lead to pneumonia, hearingproblems, blindness, brain inflammation (encephalitis), or death. The most common complication of shingles is long-term nerve pain called postherpetic neuralgia (PHN). PHN occurs in the areas where the shingles rash was, even after the rash clears up. It can last for months or years after therash goes away. The pain from PHN can be severe and debilitating. About 10 to 18% of people who get shingles will experience PHN. The risk of PHN increases with age. An older adult with shingles is more likely to develop PHN and have longer lasting and more severe pain than a younger person withshingles. Shingles is caused by the varicella zoster virus, the same virus that causes chickenpox. After you have chickenpox, the virus stays in your body and can cause shingles later in life. Shingles cannot be passed from one person to another, but the virus that causes shingles can spread and cause chickenpox insomeone who had never had chickenpox or received chickenpox vaccine. 2. Recombinant shingles vaccine Recombinant shingles vaccine provides strong protection against shingles. Bypreventing shingles, recombinant shingles vaccine  also protects against PHN. Recombinant shingles vaccine is the preferred vaccine for the prevention of shingles. However, a different vaccine, live shingles vaccine, may be used in somecircumstances. The recombinant shingles vaccine is recommended for adults 50 years and older without serious immune problems. It is given as a two-dose series. This vaccine is also recommended for people who have already gotten another type of shingles vaccine, the live shingles vaccine. There is no live virus inthis vaccine. Shingles vaccine may be given at  the same time as other vaccines. 3. Talk with your health care provider Tell your vaccine provider if the person getting the vaccine: Has had an allergic reaction after a previous dose of recombinant shingles vaccine, or has any severe, life-threatening allergies. Is pregnant or breastfeeding. Is currently experiencing an episode of shingles. In some cases, your health care provider may decide to postpone shinglesvaccination to a future visit. People with minor illnesses, such as a cold, may be vaccinated. People who are moderately or severely ill should usually wait until they recover beforegetting recombinant shingles vaccine. Your health care provider can give you more information. 4. Risks of a vaccine reaction A sore arm with mild or moderate pain is very common after recombinant shingles vaccine, affecting about 80% of vaccinated people. Redness and swelling can also happen at the site of the injection. Tiredness, muscle pain, headache, shivering, fever, stomach pain, and nausea happen after vaccination in more than half of people who receive recombinant shingles vaccine. In clinical trials, about 1 out of 6 people who got recombinant zoster vaccine experienced side effects that prevented them from doing regular activities.Symptoms usually went away on their own in 2 to 3 days. You should still get the second dose of recombinant zoster vaccine even if youhad one of these reactions after the first dose. People sometimes faint after medical procedures, including vaccination. Tellyour provider if you feel dizzy or have vision changes or ringing in the ears. As with any medicine, there is a very remote chance of a vaccine causing asevere allergic reaction, other serious injury, or death. 5. What if there is a serious problem? An allergic reaction could occur after the vaccinated person leaves the clinic. If you see signs of a severe allergic reaction (hives, swelling of the face and throat,  difficulty breathing, a fast heartbeat, dizziness, or weakness), call 9-1-1 and get the person to the nearest hospital. For other signs that concern you, call your health care provider. Adverse reactions should be reported to the Vaccine Adverse Event Reporting System (VAERS). Your health care provider will usually file this report, or you can do it yourself. Visit the VAERS website at www.vaers.LAgents.no or call 7193665842. VAERS is only for reporting reactions, and VAERS staff do not give medical advice. 6. How can I learn more? Ask your health care provider. Call your local or state health department. Contact the Centers for Disease Control and Prevention (CDC): Call 908 373 2795 (1-800-CDC-INFO) or Visit CDC's website at PicCapture.uy Vaccine Information Statement Recombinant Zoster Vaccine (12/30/2017) This information is not intended to replace advice given to you by your health care provider. Make sure you discuss any questions you have with your healthcare provider. Document Revised: 10/21/2019 Document Reviewed: 10/21/2019 Elsevier Patient Education  2022 Elsevier Inc. Pneumococcal Polysaccharide Vaccine (PPSV23): What You Need to Know 1. Why get vaccinated? Pneumococcal polysaccharide vaccine (PPSV23) can prevent pneumococcal disease. Pneumococcal disease refers to any illness caused by pneumococcal bacteria. These bacteria can cause many types of illnesses, including pneumonia, which is  an infection ofthe lungs. Pneumococcal bacteria are one of the most common causes of pneumonia. Besides pneumonia, pneumococcal bacteria can also cause: Ear infections Sinus infections Meningitis (infection of the tissue covering the brain and spinal cord) Bacteremia (bloodstream infection) Anyone can get pneumococcal disease, but children under 29 years of age, people with certain medical conditions, adults 65 years or older, and cigarettesmokers are at the highest risk. Most pneumococcal  infections are mild. However, some can result in long-term problems, such as brain damage or hearing loss. Meningitis, bacteremia, andpneumonia caused by pneumococcal disease can be fatal. 2. PPSV23 PPSV23 protects against 23 types of bacteria that cause pneumococcal disease. PPSV23 is recommended for: All adults 65 years or older, Anyone 2 years or older with certain medical conditions that can lead to an increased risk for pneumococcal disease. Most people need only one dose of PPSV23. A second dose of PPSV23, and another type of pneumococcal vaccine called PCV13, are recommended for certainhigh-risk groups. Your health care provider can give you more information. People 65 years or older should get a dose of PPSV23 even if they have alreadygotten one or more doses of the vaccine before they turned 11. 3. Talk with your health care provider Tell your vaccine provider if the person getting the vaccine: Has had an allergic reaction after a previous dose of PPSV23, or has any severe, life-threatening allergies. In some cases, your health care provider may decide to postpone PPSV23vaccination to a future visit. People with minor illnesses, such as a cold, may be vaccinated. People who are moderately or severely ill should usually wait until they recover beforegetting PPSV23. Your health care provider can give you more information. 4. Risks of a vaccine reaction Redness or pain where the shot is given, feeling tired, fever, or muscle aches can happen after PPSV23. People sometimes faint after medical procedures, including vaccination. Tellyour provider if you feel dizzy or have vision changes or ringing in the ears. As with any medicine, there is a very remote chance of a vaccine causing asevere allergic reaction, other serious injury, or death. 5. What if there is a serious problem? An allergic reaction could occur after the vaccinated person leaves the clinic. If you see signs of a severe allergic  reaction (hives, swelling of the face and throat, difficulty breathing, a fast heartbeat, dizziness, or weakness), call 9-1-1 and get the person to the nearest hospital. For other signs that concern you, call your health care provider. Adverse reactions should be reported to the Vaccine Adverse Event Reporting System (VAERS). Your health care provider will usually file this report, or you can do it yourself. Visit the VAERS website at www.vaers.LAgents.no or call 502-479-3014. VAERS is only for reporting reactions, and VAERS staff do not give medical advice. 6. How can I learn more? Ask your health care provider. Call your local or state health department. Contact the Centers for Disease Control and Prevention (CDC): Call 864-126-9472 (1-800-CDC-INFO) or Visit CDC's website at PicCapture.uy Vaccine Information Statement PPSV23 Vaccine (12/30/2017) This information is not intended to replace advice given to you by your health care provider. Make sure you discuss any questions you have with your healthcare provider. Document Revised: 10/21/2019 Document Reviewed: 10/21/2019 Elsevier Patient Education  2022 [employer].

## 2020-10-05 NOTE — Progress Notes (Signed)
Subjective:    Patient ID: Thomas Noble, male    DOB: Mar 15, 1960, 60 y.o.   MRN: [id number]  HPI Pt is a 60 yo male with HTN, HLD who presents to the clinic for annual physical.   Overall doing well. A few weeks ago had to go to ED for infected seb cyst. Doing better now but the hard area is still present. No pain though.   He does have bilateral slightly itchy foot rash for last few weeks. Only on feet. Wears socks and shoes daily.  .. Active Ambulatory Problems    Diagnosis Date Noted   Gout 11/22/2008   DEPRESSION 11/22/2008   ESSENTIAL HYPERTENSION, BENIGN 11/22/2008   DISC DISEASE, CERVICAL 11/21/2008   OSTEOPENIA 11/22/2008   ALCOHOL ABUSE, HX OF 11/22/2008   Lumbar spondylosis 06/14/2014   Elevated fasting glucose 08/26/2015   Pure hypercholesterolemia 06/20/2016   Resolved Ambulatory Problems    Diagnosis Date Noted   HLD (hyperlipidemia) 11/22/2008   ACUTE NASOPHARYNGITIS 12/18/2009   ABSCESS, TOOTH 01/30/2010   GASTROENTERITIS WITHOUT DEHYDRATION 01/31/2009   DISC DISEASE, LUMBAR 11/21/2008   Muscle spasm of back 06/14/2014   DDD (degenerative disc disease), lumbar 06/14/2014   Left-sided low back pain without sciatica 02/12/2015   Viral URI with cough 05/31/2015   No Additional Past Medical History     Review of Systems  All other systems reviewed and are negative.     Objective:   Physical Exam BP 135/78   Pulse 75   Temp 98.4 F (36.9 C)   Ht 6' 1.5" (1.867 m)   Wt 270 lb 12.8 oz (122.8 kg)   SpO2 99%   BMI 35.24 kg/m   General Appearance:    Alert, cooperative, no distress, appears stated age  Head:    Normocephalic, without obvious abnormality, atraumatic  Eyes:    PERRL, conjunctiva/corneas clear, EOM's intact, fundi    benign, both eyes       Ears:    Normal TM's and external ear canals, both ears  Nose:   Nares normal, septum midline, mucosa normal, no drainage    or sinus tenderness  Throat:   Lips, mucosa, and tongue normal; teeth and  gums normal  Neck:   Supple, symmetrical, trachea midline, no adenopathy;       thyroid:  No enlargement/tenderness/nodules; no carotid   bruit or JVD  Back:     Symmetric, no curvature, ROM normal, no CVA tenderness  Lungs:     Clear to auscultation bilaterally, respirations unlabored  Chest wall:    No tenderness or deformity  Heart:    Regular rate and rhythm, S1 and S2 normal, no murmur, rub   or gallop  Abdomen:     Soft, non-tender, bowel sounds active all four quadrants,    no masses, no organomegaly        Extremities:   Extremities normal, atraumatic, no cyanosis or edema  Pulses:   2+ and symmetric all extremities  Skin:   Skin color, texture, turgor normal. Bilateral feet with macular erythematous rash only on feet. Multiple thick dystrophic toenails. Left medial just below elbow 2.5cm by 2 cm mobile non tender firm raised patch of skin.   Lymph nodes:   Cervical, supraclavicular, and axillary nodes normal  Neurologic:   CNII-XII intact. Normal strength, sensation and reflexes      throughout     .[name] Depression screen [facility] 2/9 11/16/2019 04/13/2018 03/17/2017 06/20/2016  Decreased Interest 0 0 0 0  Down,  Depressed, Hopeless 0 0 0 0  PHQ - 2 Score 0 0 0 0  Altered sleeping - 0 - -  Tired, decreased energy - 0 - -  Change in appetite - 0 - -  Feeling bad or failure about yourself  - 0 - -  Trouble concentrating - 0 - -  Moving slowly or fidgety/restless - 0 - -  Suicidal thoughts - 0 - -  PHQ-9 Score - 0 - -  Difficult doing work/chores - Not difficult at all - -       Assessment & Plan:  .Marland KitchenEnrrique was seen today for annual exam.  Diagnoses and all orders for this visit:  Routine physical examination  Need for pneumococcal vaccination -     Cancel: Pneumococcal conjugate vaccine 20-valent -     Pneumococcal polysaccharide vaccine 23-valent greater than or equal to 2yo subcutaneous/IM  Need for shingles vaccine -     Varicella-zoster vaccine IM  Essential  hypertension, benign -     amLODipine (NORVASC) 10 MG tablet; TAKE 1 TABLET(10 MG) BY MOUTH DAILY.  Pure hypercholesterolemia -     pravastatin (PRAVACHOL) 80 MG tablet; TAKE 1 TABLET (80 MG TOTAL) BY MOUTH DAILY.  Chronic gout without tophus, unspecified cause, unspecified site -     allopurinol (ZYLOPRIM) 300 MG tablet; Take 1 tablet (300 mg total) by mouth daily.  Onychomycosis -     terbinafine (LAMISIL) 250 MG tablet; Take 1 tablet (250 mg total) by mouth daily.  .. Discussed 150 minutes of exercise a week.  Encouraged vitamin D 1000 units and Calcium 1300mg  or 4 servings of dairy a day.  Labs done 5/22. Reviewed.  PSA done 2021. No new symptoms.  PHQ no concerns.  Medications refilled.  Pt declined colonoscopy. He is well aware of risk.  Shingrix UTD, 2nd shot given today.  Covid UTD.  Pneumonia shot UTD.   BP to goal.  Need flu shot when comes out.   Likely seb cyst possible lipoma of left inner lower arm. Recent abx due to infection. Referral for removal.   Macular erythematous rash of only feet. Suspect more heat rash. Encouraged to keep feet dry and time out of socks. Follow up as needed or if rash is symptomatic or spreads.

## 2020-10-08 DIAGNOSIS — B351 Tinea unguium: Secondary | ICD-10-CM | POA: Insufficient documentation

## 2020-10-08 DIAGNOSIS — L723 Sebaceous cyst: Secondary | ICD-10-CM | POA: Insufficient documentation

## 2020-10-08 DIAGNOSIS — R21 Rash and other nonspecific skin eruption: Secondary | ICD-10-CM | POA: Insufficient documentation

## 2020-10-14 ENCOUNTER — Encounter: Payer: Self-pay | Admitting: Physician Assistant

## 2020-10-15 ENCOUNTER — Telehealth: Payer: Self-pay

## 2020-10-15 NOTE — Telephone Encounter (Signed)
Opened in error. T. Yareth Kearse, CMA 

## 2020-10-15 NOTE — Telephone Encounter (Signed)
Called pt to schedule an appointment.  Pt was offered an appointment for today with Christen Butter at 1:40pm.  However, pt stated that he had already called the office and scheduled an appointment for Wednesday, August 17th with Tandy Gaw and that he could wait until that appointment rather than scheduling for today.  Tiajuana Amass, CMA

## 2020-10-17 ENCOUNTER — Telehealth (INDEPENDENT_AMBULATORY_CARE_PROVIDER_SITE_OTHER): Payer: No Typology Code available for payment source | Admitting: Physician Assistant

## 2020-10-17 ENCOUNTER — Encounter: Payer: Self-pay | Admitting: Physician Assistant

## 2020-10-17 DIAGNOSIS — R059 Cough, unspecified: Secondary | ICD-10-CM

## 2020-10-17 DIAGNOSIS — U071 COVID-19: Secondary | ICD-10-CM | POA: Diagnosis not present

## 2020-10-17 MED ORDER — BENZONATATE 200 MG PO CAPS: 200.0000 mg | ORAL_CAPSULE | Freq: Two times a day (BID) | ORAL | 0 refills | Status: DC | PRN

## 2020-10-17 NOTE — Progress Notes (Signed)
Patient ID: Thomas Noble, male   DOB: [birth year], 60 y.o.   MRN: [id number] .Marland KitchenVirtual Visit via Video Note  I connected with [name] on 10/17/20 at  8:30 AM EDT by a video enabled telemedicine application and verified that I am speaking with the correct person using two identifiers.  Location: Patient: home Provider: clinic  .Marland KitchenParticipating in visit:  Patient: Thomas Noble Provider: Tandy Gaw PA-C   I discussed the limitations of evaluation and management by telemedicine and the availability of in person appointments. The patient expressed understanding and agreed to proceed.  History of Present Illness: Pt is a 60 yo male with positive covid test on 10/13/2020. He is on day 5 of symptoms. He is not taking anything for symptoms. He is fatigued and coughing a lot. He has some sinus pressure and congestion. No problems breathing, edema, fever, chills, body aches. He wants to know what to take. He is feeling a little better today than he has felt.   .. Active Ambulatory Problems    Diagnosis Date Noted   Gout 11/22/2008   DEPRESSION 11/22/2008   ESSENTIAL HYPERTENSION, BENIGN 11/22/2008   DISC DISEASE, CERVICAL 11/21/2008   OSTEOPENIA 11/22/2008   ALCOHOL ABUSE, HX OF 11/22/2008   Lumbar spondylosis 06/14/2014   Elevated fasting glucose 08/26/2015   Pure hypercholesterolemia 06/20/2016   Onychomycosis 10/08/2020   Sebaceous cyst 10/08/2020   Rash 10/08/2020   Resolved Ambulatory Problems    Diagnosis Date Noted   HLD (hyperlipidemia) 11/22/2008   ACUTE NASOPHARYNGITIS 12/18/2009   ABSCESS, TOOTH 01/30/2010   GASTROENTERITIS WITHOUT DEHYDRATION 01/31/2009   DISC DISEASE, LUMBAR 11/21/2008   Muscle spasm of back 06/14/2014   DDD (degenerative disc disease), lumbar 06/14/2014   Left-sided low back pain without sciatica 02/12/2015   Viral URI with cough 05/31/2015   No Additional Past Medical History      Observations/Objective: No acute distress  Normal mood and  appearance No labored breathing Productive cough  No vitals.    Assessment and Plan: Marland KitchenMarland KitchenDiagnoses and all orders for this visit:  COVID-19 virus infection -     benzonatate (TESSALON) 200 MG capsule; Take 1 capsule (200 mg total) by mouth 2 (two) times daily as needed for cough.  Cough -     benzonatate (TESSALON) 200 MG capsule; Take 1 capsule (200 mg total) by mouth 2 (two) times daily as needed for cough.  Pt is still pretty fatigued and coughing. Continue out of work until Monday.  [email or web address] Discussed hydration, rest, good deep breathing exercises.  Discussed antiviral but decided against it. He is getting better.  Ok for cough drops, tylenol, mucinex. Tessalon pearls sent to pharmacy.  Discussed red flags signs and symptoms that need more follow up.  Ok to get booster in the next 4 weeks for covid after your symptoms resolve.    Follow Up Instructions:    I discussed the assessment and treatment plan with the patient. The patient was provided an opportunity to ask questions and all were answered. The patient agreed with the plan and demonstrated an understanding of the instructions.   The patient was advised to call back or seek an in-person evaluation if the symptoms worsen or if the condition fails to improve as anticipated.   Tandy Gaw, PA-C

## 2020-10-26 ENCOUNTER — Telehealth: Payer: Self-pay

## 2020-10-26 NOTE — Telephone Encounter (Signed)
Pt dropped paperwork for FMLA renewal off at the front desk. Patient wanted to have the paperwork faxed once completed to [phone]. He is aware of the fee and that we will not fax the paperwork until the fee is paid. He asked to be called as soon as paperwork is completed. Paperwork was placed in the message box.

## 2020-10-30 ENCOUNTER — Other Ambulatory Visit: Payer: Self-pay | Admitting: Sports Medicine

## 2020-10-30 DIAGNOSIS — M47816 Spondylosis without myelopathy or radiculopathy, lumbar region: Secondary | ICD-10-CM

## 2020-10-30 NOTE — Telephone Encounter (Signed)
EDIT: Dom had rescheduled patient for next Friday, Sept 9th, for [employer] and I noticed after I sent previous message. AM

## 2020-10-30 NOTE — Telephone Encounter (Signed)
Patient has made an appointment for next Tuesday, Sept 6th, to have FMLA paperwork filled out. AM

## 2020-10-30 NOTE — Telephone Encounter (Signed)
Called pt and pt wasn't there and I spoke with his wife to let him know that Dr. Karie Schwalbe needs him to schedule an appt with him in order to fill the paperwork out for him. She stated that she would let him know. I called him 10/26/2020 and I tried to call him again today(twice) 10/30/2020 left a voicemail.

## 2020-11-06 ENCOUNTER — Encounter: Payer: Self-pay | Admitting: Family Medicine

## 2020-11-06 ENCOUNTER — Ambulatory Visit: Payer: No Typology Code available for payment source | Admitting: Family Medicine

## 2020-11-06 ENCOUNTER — Ambulatory Visit: Payer: No Typology Code available for payment source | Admitting: Sports Medicine

## 2020-11-06 ENCOUNTER — Other Ambulatory Visit: Payer: Self-pay

## 2020-11-06 VITALS — BP 158/89 | HR 69 | Ht 75.0 in | Wt 270.0 lb

## 2020-11-06 DIAGNOSIS — M545 Low back pain, unspecified: Secondary | ICD-10-CM | POA: Diagnosis not present

## 2020-11-06 DIAGNOSIS — M47816 Spondylosis without myelopathy or radiculopathy, lumbar region: Secondary | ICD-10-CM | POA: Diagnosis not present

## 2020-11-06 DIAGNOSIS — G8929 Other chronic pain: Secondary | ICD-10-CM

## 2020-11-06 MED ORDER — METHYLPREDNISOLONE ACETATE 80 MG/ML IJ SUSP
80.0000 mg | Freq: Once | INTRAMUSCULAR | Status: AC
  Administered 2020-11-06: 80 mg via INTRAMUSCULAR

## 2020-11-06 MED ORDER — CYCLOBENZAPRINE HCL 10 MG PO TABS: 10.0000 mg | ORAL_TABLET | Freq: Three times a day (TID) | ORAL | 0 refills | Status: DC | PRN

## 2020-11-06 MED ORDER — PREDNISONE 50 MG PO TABS: ORAL_TABLET | ORAL | 0 refills | Status: DC

## 2020-11-06 NOTE — Progress Notes (Signed)
Thomas Noble - 60 y.o. male MRN [id number]  Date of birth: 1960-09-17  Subjective Chief Complaint  Patient presents with   Back Pain    HPI [name] is a 60 year old male here today with complaint of right-sided lower back pain.  He has a history of lumbar spondylosis and has had flares of his back pain in the past that have been similar to this.  He describes tightness as well as pain along the right lower side with radiation into the upper buttock area.  He denies radiation down the leg, numbness, tingling or weakness.  Pain is worse with standing and better with sitting.  He is taking Celebrex daily.  He has had to use steroids and/or muscle relaxers in the past to help with these flares.  ROS:  A comprehensive ROS was completed and negative except as noted per HPI  No Known Allergies  Past Medical History:  Diagnosis Date   Essential hypertension, benign 11/22/2008   Qualifier: Diagnosis of  By: [name]     Gout 11/22/2008   Qualifier: Diagnosis of  By: [name]     HLD (hyperlipidemia) 11/22/2008   Qualifier: Diagnosis of  By: [name]      History reviewed. No pertinent surgical history.  Social History   Socioeconomic History   Marital status: Married    Spouse name: Not on file   Number of children: Not on file   Years of education: Not on file   Highest education level: Not on file  Occupational History   Not on file  Tobacco Use   Smoking status: Never   Smokeless tobacco: Current    Types: Snuff  Vaping Use   Vaping Use: Never used  Substance and Sexual Activity   Alcohol use: Not Currently   Drug use: Not on file   Sexual activity: Not on file  Other Topics Concern   Not on file  Social History Narrative   Not on file   Social Determinants of Health   Financial Resource Strain: Not on file  Food Insecurity: Not on file  Transportation Needs: Not on file  Physical Activity: Not on file  Stress: Not on file  Social Connections: Not on  file    History reviewed. No pertinent family history.  Health Maintenance  Topic Date Due   INFLUENZA VACCINE  10/01/2020   COLONOSCOPY (Pts 45-43yrs Insurance coverage will need to be confirmed)  11/15/2020 (Originally 04/28/2004)   Hepatitis C Screening  11/15/2020 (Originally 04/28/1977)   HIV Screening  11/15/2020 (Originally 04/28/1974)   TETANUS/TDAP  08/14/2022   COVID-19 Vaccine  Completed   Zoster Vaccines- Shingrix  Completed   HPV VACCINES  Aged Out   Pneumococcal Vaccine 65-75 Years old  Discontinued     ----------------------------------------------------------------------------------------------------------------------------------------------------------------------------------------------------------------- Physical Exam BP (!) 158/89 (BP Location: Left Arm, Patient Position: Sitting, Cuff Size: Large)   Pulse 69   Ht 6\' 3"  (1.905 m)   Wt 270 lb (122.5 kg)   SpO2 100%   BMI 33.75 kg/m   Physical Exam Constitutional:      Appearance: Normal appearance.  Musculoskeletal:     Comments: Increased pain with standing.  Range of motion is fairly good with pain on flexion.  Negative straight leg test.  Strength and sensation are normal.  Skin:    General: Skin is warm and dry.  Neurological:     General: No focal deficit present.     Mental Status: He is alert.    -------------------------------------------------------------------------------------------------------------------------------------------------------------------------------------------------------------------  Assessment and Plan  Lumbar spondylosis He was given an injection of Depo-Medrol 80 mg today in the office.  We will have him start prednisone 50 mg daily over the next 5 days starting tomorrow.  Prescription for Flexeril renewed to use as needed for spasm.  He may continue Celebrex for now.  Note written for him to return to work in 2 days.   Meds ordered this encounter  Medications    predniSONE (DELTASONE) 50 MG tablet    Sig: Take 1 tab po daily x5 days    Dispense:  5 tablet    Refill:  0   cyclobenzaprine (FLEXERIL) 10 MG tablet    Sig: Take 1 tablet (10 mg total) by mouth 3 (three) times daily as needed for muscle spasms.    Dispense:  30 tablet    Refill:  0   methylPREDNISolone acetate (DEPO-MEDROL) injection 80 mg    No follow-ups on file.    This visit occurred during the SARS-CoV-2 public health emergency.  Safety protocols were in place, including screening questions prior to the visit, additional usage of staff PPE, and extensive cleaning of exam room while observing appropriate contact time as indicated for disinfecting solutions.

## 2020-11-06 NOTE — Assessment & Plan Note (Signed)
He was given an injection of Depo-Medrol 80 mg today in the office.  We will have him start prednisone 50 mg daily over the next 5 days starting tomorrow.  Prescription for Flexeril renewed to use as needed for spasm.  He may continue Celebrex for now.  Note written for him to return to work in 2 days.

## 2020-11-06 NOTE — Patient Instructions (Signed)
Spondylolysis Rehab Ask your health care provider which exercises are safe for you. Do exercises exactly as told by your health care provider and adjust them as directed. It is normal to feel mild stretching, pulling, tightness, or discomfort as you do these exercises. Stop right away if you feel sudden pain or your pain gets worse. Do not begin these exercises until told by your health care provider. Stretching and range-of-motion exercises These exercises warm up your muscles and joints and improve the movement and flexibility of your hips and your back. These exercises may also help to relieve pain, numbness, and tingling. Single knee to chest  Lie on your back on a firm surface with both legs straight. Bend one of your knees. Use your hands to move your knee up toward your chest until you feel a gentle stretch in your lower back and buttock. Hold your leg in this position by holding on to the front of your knee. Keep your other leg as straight as possible. Hold for __________ seconds. Slowly return to the starting position. Repeat this exercise with your other leg. Repeat __________ times. Complete this exercise __________ times a day. Hamstring stretch, supine  Lie on your back (supine position). Loop a belt or towel over the ball of your left / right foot. The ball of your foot is on the walking surface, right under your toes. Straighten your left / right knee and slowly pull on the belt or towel to raise your leg. Raise your leg until you feel a gentle stretch behind your knee or thigh (hamstring). Do not let your left / right knee bend while you do this. Keep your other leg flat on the floor. Hold this position for __________ seconds. Slowly return your leg to the starting position. Repeat this exercise with your other leg. Repeat __________ times. Complete this exercise __________ times a day. Strengthening exercises These exercises build strength and endurance in your back.  Endurance is the ability to use your muscles for a long time, even after they get tired. Pelvic tilt This exercise strengthens the muscles that lie deep in the abdomen. Lie on your back on a firm bed or the floor. Bend your knees and keep your feet flat. Tense your abdominal muscles. Tip your pelvis up toward the ceiling and flatten your lower back into the floor. To help with this exercise, you may place a small towel under your lower back and try to push your back into the towel. Hold for __________ seconds. Let your muscles relax completely before you repeat this exercise. Repeat __________ times. Complete this exercise __________ times a day. Abdominal crunch  Lie on your back on a firm surface. Bend your knees and keep your feet flat. Cross your arms over your chest. Tuck your chin down toward your chest, without bending your neck. Use your abdominal muscles to lift your upper body off the ground, straight up into the air. Try to lift yourself until your shoulder blades are off the ground. You may need to work up to this. Keep your lower back on the ground while you crunch upward. Do not hold your breath. Slowly lower yourself down. Keep your abdominal muscles tense until you are back to the starting position. Repeat __________ times. Complete this exercise __________ times a day. Alternating arm and leg raises  Get on your hands and knees on a firm surface. If you are on a hard floor, you may want to use padding, such as an  exercise mat, to cushion your knees. Line up your arms and legs. Your hands should be directly below your shoulders, and your knees should be directly below your hips. Lift your left leg behind you. At the same time, raise your right arm and straighten it in front of you. Do not lift your leg higher than your hip. Do not lift your arm higher than your shoulder. Keep your abdominal and back muscles tight. Keep your hips facing the ground. Do not arch your  back. Keep your balance carefully, and do not hold your breath. Hold for __________ seconds. Slowly return to the starting position. Repeat with your right leg and your left arm. Repeat __________ times. Complete this exercise __________ times a day. Posture and body mechanics Good posture and healthy body mechanics can help to relieve stress in your body's tissues and joints. Body mechanics refers to the movements and positions of your body while you do your daily activities. Posture is part of body mechanics. Good posture means: Your spine is in its natural S-curve position (neutral). Your shoulders are pulled back slightly. Your head is not tipped forward. Follow these guidelines to improve your posture and body mechanics in your everyday activities. Standing  When standing, keep your spine neutral and your feet about hip width apart. Keep a slight bend in your knees. Your ears, shoulders, and hips should line up. When you do a task in which you stand in one place for a long time, place one foot up on a stable object that is 2-4 inches (5-10 cm) high, such as a footstool. This helps keep your spine neutral. Sitting  When sitting, keep your spine neutral and keep your feet flat on the floor. Use a footrest, if necessary, and keep your thighs parallel to the floor. Avoid rounding your shoulders, and avoid tilting your head forward. When working at a desk or a computer, keep your desk at a height where your hands are slightly lower than your elbows. Slide your chair under your desk so you are close enough to maintain good posture. When working at a computer, place your monitor at a height where you are looking straight ahead and you do not have to tilt your head forward or downward to look at the screen. Resting  When lying down and resting, avoid positions that are most painful for you. If you have pain with activities such as sitting, bending, stooping, or squatting (flexion-based  activities), lie in a position in which your body does not bend very much. For example, avoid curling up on your side with your arms and knees near your chest (fetal position). If you have pain with activities such as standing for a long time or reaching with your arms (extension-based activities), lie with your spine in a neutral position and bend your knees slightly. Try the following positions: Lying on your side with a pillow between your knees. Lying on your back with a pillow under your knees. Lifting  When lifting objects, keep your feet at least shoulder width apart and tighten your abdominal muscles. Bend your knees and hips and keep your spine neutral. It is important to lift using the strength of your legs, not your back. Do not lock your knees straight out. Always ask for help to lift heavy or awkward objects. This information is not intended to replace advice given to you by your health care provider. Make sure you discuss any questions you have with your health care provider. Document Revised: 06/14/2018  Document Reviewed: 06/14/2018 Elsevier Patient Education  2022 [employer].

## 2020-11-09 ENCOUNTER — Ambulatory Visit (INDEPENDENT_AMBULATORY_CARE_PROVIDER_SITE_OTHER): Payer: No Typology Code available for payment source | Admitting: Sports Medicine

## 2020-11-09 DIAGNOSIS — M47816 Spondylosis without myelopathy or radiculopathy, lumbar region: Secondary | ICD-10-CM | POA: Diagnosis not present

## 2020-11-09 NOTE — Assessment & Plan Note (Signed)
Thomas Noble has occasional flares, he does have lumbar spondylosis. Today we filled out his FMLA paperwork again for 1 year, this will be scanned in and faxed. Last x-rays were in March, has not yet had an MRI.

## 2020-11-09 NOTE — Progress Notes (Signed)
    Procedures performed today:    None.  Independent interpretation of notes and tests performed by another provider:   None.  Brief History, Exam, Impression, and Recommendations:    Lumbar spondylosis Thomas Noble has occasional flares, he does have lumbar spondylosis. Today we filled out his FMLA paperwork again for 1 year, this will be scanned in and faxed. Last x-rays were in March, has not yet had an MRI.    ___________________________________________ Thomas Noble. Benjamin Stain, M.D., ABFM., CAQSM. Primary Care and Sports Medicine [facility]  Adjunct Instructor of Family Medicine  [employer]

## 2020-11-13 LAB — HEMOGLOBIN A1C
Hemoglobin A1C: 6
Hemoglobin A1C: 6

## 2020-11-20 ENCOUNTER — Encounter: Payer: Self-pay | Admitting: Neurology

## 2020-11-29 ENCOUNTER — Encounter: Payer: Self-pay | Admitting: Physician Assistant

## 2020-11-30 ENCOUNTER — Other Ambulatory Visit: Payer: Self-pay | Admitting: Physician Assistant

## 2020-11-30 DIAGNOSIS — M1A9XX0 Chronic gout, unspecified, without tophus (tophi): Secondary | ICD-10-CM

## 2020-12-08 ENCOUNTER — Encounter: Payer: Self-pay | Admitting: Physician Assistant

## 2021-01-30 ENCOUNTER — Other Ambulatory Visit: Payer: Self-pay | Admitting: Sports Medicine

## 2021-01-30 DIAGNOSIS — M47816 Spondylosis without myelopathy or radiculopathy, lumbar region: Secondary | ICD-10-CM

## 2021-02-04 ENCOUNTER — Telehealth: Payer: No Typology Code available for payment source | Admitting: Emergency Medicine

## 2021-02-04 DIAGNOSIS — B9789 Other viral agents as the cause of diseases classified elsewhere: Secondary | ICD-10-CM | POA: Diagnosis not present

## 2021-02-04 DIAGNOSIS — J019 Acute sinusitis, unspecified: Secondary | ICD-10-CM

## 2021-02-04 MED ORDER — FLUTICASONE PROPIONATE 50 MCG/ACT NA SUSP: 2.0000 | Freq: Every day | NASAL | 0 refills | Status: DC

## 2021-02-04 MED ORDER — AZELASTINE HCL 0.1 % NA SOLN: 2.0000 | Freq: Two times a day (BID) | NASAL | 0 refills | Status: DC

## 2021-02-04 NOTE — Progress Notes (Signed)
E-Visit for Sinus Problems  We are sorry that you are not feeling well.  Here is how we plan to help!  Based on what you have shared with me it looks like you have sinusitis.  Sinusitis is inflammation and infection in the sinus cavities of the head.  Based on your presentation I believe you most likely have Acute Viral Sinusitis.This is an infection most likely caused by a virus. There is not specific treatment for viral sinusitis other than to help you with the symptoms until the infection runs its course.  You may use an oral decongestant such as Mucinex D or if you have glaucoma or high blood pressure use plain Mucinex. Saline nasal spray help and can safely be used as often as needed for congestion. I have prescribed: Fluticasone nasal spray two sprays in each nostril once a day and Azelastine nasal spray 2 sprays in each nostril twice a day  Some authorities believe that zinc sprays or the use of Echinacea may shorten the course of your symptoms.  Sinus infections are not as easily transmitted as other respiratory infection, however we still recommend that you avoid close contact with loved ones, especially the very young and elderly.  Remember to wash your hands thoroughly throughout the day as this is the number one way to prevent the spread of infection!  Home Care: Only take medications as instructed by your medical team. Do not take these medications with alcohol. A steam or ultrasonic humidifier can help congestion.  You can place a towel over your head and breathe in the steam from hot water coming from a faucet. Avoid close contacts especially the very young and the elderly. Cover your mouth when you cough or sneeze. Always remember to wash your hands.  Get Help Right Away If: You develop worsening fever or sinus pain. You develop a severe head ache or visual changes. Your symptoms persist after you have completed your treatment plan.  Make sure you Understand these  instructions. Will watch your condition. Will get help right away if you are not doing well or get worse.   Thank you for choosing an e-visit.  Your e-visit answers were reviewed by a board certified advanced clinical practitioner to complete your personal care plan. Depending upon the condition, your plan could have included both over the counter or prescription medications.  Please review your pharmacy choice. Make sure the pharmacy is open so you can pick up prescription now. If there is a problem, you may contact your provider through [employer] and have the prescription routed to another pharmacy.  Your safety is important to [place]. If you have drug allergies check your prescription carefully.   For the next 24 hours you can use MyChart to ask questions about today's visit, request a non-urgent call back, or ask for a work or school excuse. You will get an email in the next two days asking about your experience. I hope that your e-visit has been valuable and will speed your recovery.  I have spent 5 minutes in review of e-visit questionnaire, review and updating patient chart, medical decision making and response to patient.   Rica Mast, PhD, FNP-BC

## 2021-02-20 ENCOUNTER — Encounter (INDEPENDENT_AMBULATORY_CARE_PROVIDER_SITE_OTHER): Payer: No Typology Code available for payment source | Admitting: Sports Medicine

## 2021-02-20 DIAGNOSIS — M47816 Spondylosis without myelopathy or radiculopathy, lumbar region: Secondary | ICD-10-CM

## 2021-02-20 MED ORDER — PREDNISONE 50 MG PO TABS: ORAL_TABLET | ORAL | 0 refills | Status: DC

## 2021-02-20 MED ORDER — CYCLOBENZAPRINE HCL 10 MG PO TABS: 10.0000 mg | ORAL_TABLET | Freq: Three times a day (TID) | ORAL | 0 refills | Status: DC | PRN

## 2021-02-20 NOTE — Telephone Encounter (Signed)
I spent 5 total minutes of online digital evaluation and management services in this patient-initiated request for online care. 

## 2021-02-25 ENCOUNTER — Encounter: Payer: Self-pay | Admitting: Physician Assistant

## 2021-02-25 DIAGNOSIS — M1A9XX0 Chronic gout, unspecified, without tophus (tophi): Secondary | ICD-10-CM

## 2021-02-25 DIAGNOSIS — E7800 Pure hypercholesterolemia, unspecified: Secondary | ICD-10-CM

## 2021-03-01 MED ORDER — ALLOPURINOL 300 MG PO TABS: 300.0000 mg | ORAL_TABLET | Freq: Every day | ORAL | 2 refills | Status: DC

## 2021-03-01 MED ORDER — PRAVASTATIN SODIUM 80 MG PO TABS: ORAL_TABLET | ORAL | 2 refills | Status: DC

## 2021-03-11 ENCOUNTER — Encounter: Payer: Self-pay | Admitting: Physician Assistant

## 2021-03-11 DIAGNOSIS — I10 Essential (primary) hypertension: Secondary | ICD-10-CM

## 2021-03-11 MED ORDER — AMLODIPINE BESYLATE 10 MG PO TABS
ORAL_TABLET | ORAL | 3 refills | Status: DC
Start: 2021-03-11 — End: 2022-02-20

## 2021-03-19 LAB — HEMOGLOBIN A1C: Hemoglobin A1C: 4.8

## 2021-03-22 ENCOUNTER — Telehealth: Payer: No Typology Code available for payment source | Admitting: Emergency Medicine

## 2021-03-22 DIAGNOSIS — R6889 Other general symptoms and signs: Secondary | ICD-10-CM | POA: Diagnosis not present

## 2021-03-22 NOTE — Progress Notes (Signed)
E visit for Flu like symptoms   We are sorry that you are not feeling well.  Here is how we plan to help! Based on what you have shared with me it looks like you may have flu-like symptoms that should be watched but do not seem to indicate anti-viral treatment.  You could have COVID or Flu.  Many of the patient's I'm seeing with COVID have been having delayed positive results.  I'd recommend testing each day for the next 3 days.  Let us know if it turns positive.    I've sent a work note to [employer].  Influenza or the flu is   an infection caused by a respiratory virus. The flu virus is highly contagious and persons who did not receive their yearly flu vaccination may catch the flu from close contact.  We have anti-viral medications to treat the viruses that cause this infection. They are not a cure and only shorten the course of the infection. These prescriptions are most effective when they are given within the first 2 days of flu symptoms. Antiviral medication are indicated if you have a high risk of complications from the flu. You should  also consider an antiviral medication if you are in close contact with someone who is at risk. These medications can help patients avoid complications from the flu  but have side effects that you should know. Possible side effects from Tamiflu or oseltamivir include nausea, vomiting, diarrhea, dizziness, headaches, eye redness, sleep problems or other respiratory symptoms. You should not take Tamiflu if you have an allergy to oseltamivir or any to the ingredients in Tamiflu.  Based upon your symptoms and potential risk factors I recommend that you follow the flu symptoms recommendation that I have listed below.  ANYONE WHO HAS FLU SYMPTOMS SHOULD: Stay home. The flu is highly contagious and going out or to work exposes others! Be sure to drink plenty of fluids. Water is fine as well as fruit juices, sodas and electrolyte beverages. You may want to stay  away from caffeine or alcohol. If you are nauseated, try taking small sips of liquids. How do you know if you are getting enough fluid? Your urine should be a pale yellow or almost colorless. Get rest. Taking a steamy shower or using a humidifier may help nasal congestion and ease sore throat pain. Using a saline nasal spray works much the same way. Cough drops, hard candies and sore throat lozenges may ease your cough. Line up a caregiver. Have someone check on you regularly.   GET HELP RIGHT AWAY IF: You cannot keep down liquids or your medications. You become short of breath Your fell like you are going to pass out or loose consciousness. Your symptoms persist after you have completed your treatment plan MAKE SURE YOU  Understand these instructions. Will watch your condition. Will get help right away if you are not doing well or get worse.  Your e-visit answers were reviewed by a board certified advanced clinical practitioner to complete your personal care plan.  Depending on the condition, your plan could have included both over the counter or prescription medications.  If there is a problem please reply  once you have received a response from your provider.  Your safety is important to [place].  If you have drug allergies check your prescription carefully.    You can use MyChart to ask questions about todays visit, request a non-urgent call back, or ask for a work or school excuse for  24 hours related to this e-Visit. If it has been greater than 24 hours you will need to follow up with your provider, or enter a new e-Visit to address those concerns.  You will get an e-mail in the next two days asking about your experience.  I hope that your e-visit has been valuable and will speed your recovery. Thank you for using e-visits.  Approximately 5 minutes was used in reviewing the patient's chart, questionnaire, prescribing medications, and documentation.

## 2021-03-25 ENCOUNTER — Telehealth: Payer: PRIVATE HEALTH INSURANCE | Admitting: Nurse Practitioner

## 2021-03-25 DIAGNOSIS — J111 Influenza due to unidentified influenza virus with other respiratory manifestations: Secondary | ICD-10-CM

## 2021-03-25 NOTE — Progress Notes (Signed)
E visit for Flu like symptoms   We are sorry that you are not feeling well.  Here is how we plan to help! Based on what you have shared with me it looks like you may have a respiratory virus that may be influenza.  We are happy to provide you with another day out of work. We would recommend if symptoms worsen or are not improved by tomorrow that you are seen in person for follow up.   ANYONE WHO HAS FLU SYMPTOMS SHOULD: Stay home. The flu is highly contagious and going out or to work exposes others! Be sure to drink plenty of fluids. Water is fine as well as fruit juices, sodas and electrolyte beverages. You may want to stay away from caffeine or alcohol. If you are nauseated, try taking small sips of liquids. How do you know if you are getting enough fluid? Your urine should be a pale yellow or almost colorless. Get rest. Taking a steamy shower or using a humidifier may help nasal congestion and ease sore throat pain. Using a saline nasal spray works much the same way. Cough drops, hard candies and sore throat lozenges may ease your cough. Line up a caregiver. Have someone check on you regularly.   GET HELP RIGHT AWAY IF: You cannot keep down liquids or your medications. You become short of breath Your fell like you are going to pass out or loose consciousness. Your symptoms persist after you have completed your treatment plan MAKE SURE YOU  Understand these instructions. Will watch your condition. Will get help right away if you are not doing well or get worse.  Your e-visit answers were reviewed by a board certified advanced clinical practitioner to complete your personal care plan.  Depending on the condition, your plan could have included both over the counter or prescription medications.  If there is a problem please reply  once you have received a response from your provider.  Your safety is important to [place].  If you have drug allergies check your prescription carefully.    You  can use MyChart to ask questions about todays visit, request a non-urgent call back, or ask for a work or school excuse for 24 hours related to this e-Visit. If it has been greater than 24 hours you will need to follow up with your provider, or enter a new e-Visit to address those concerns.  You will get an e-mail in the next two days asking about your experience.  I hope that your e-visit has been valuable and will speed your recovery. Thank you for using e-visits.   I spent approximately 7 minutes reviewing the patient's history, current symptoms and coordinating their plan of care today.

## 2021-03-27 ENCOUNTER — Encounter: Payer: Self-pay | Admitting: Neurology

## 2021-04-15 ENCOUNTER — Encounter: Payer: Self-pay | Admitting: Physician Assistant

## 2021-04-16 MED ORDER — TERBINAFINE HCL 250 MG PO TABS: 250.0000 mg | ORAL_TABLET | Freq: Every day | ORAL | 0 refills | Status: AC

## 2021-05-08 ENCOUNTER — Other Ambulatory Visit: Payer: Self-pay | Admitting: Sports Medicine

## 2021-05-08 DIAGNOSIS — M47816 Spondylosis without myelopathy or radiculopathy, lumbar region: Secondary | ICD-10-CM

## 2021-05-31 ENCOUNTER — Other Ambulatory Visit: Payer: Self-pay | Admitting: Sports Medicine

## 2021-05-31 DIAGNOSIS — M47816 Spondylosis without myelopathy or radiculopathy, lumbar region: Secondary | ICD-10-CM

## 2021-06-04 MED ORDER — CYCLOBENZAPRINE HCL 10 MG PO TABS
10.0000 mg | ORAL_TABLET | Freq: Three times a day (TID) | ORAL | 0 refills | Status: DC | PRN
Start: 2021-06-04 — End: 2021-09-30

## 2021-06-27 ENCOUNTER — Encounter: Payer: Self-pay | Admitting: Sports Medicine

## 2021-07-01 ENCOUNTER — Encounter: Payer: Self-pay | Admitting: Family Medicine

## 2021-07-01 ENCOUNTER — Ambulatory Visit: Payer: PRIVATE HEALTH INSURANCE | Admitting: Family Medicine

## 2021-07-01 ENCOUNTER — Other Ambulatory Visit: Payer: Self-pay | Admitting: Physician Assistant

## 2021-07-01 DIAGNOSIS — M47816 Spondylosis without myelopathy or radiculopathy, lumbar region: Secondary | ICD-10-CM | POA: Diagnosis not present

## 2021-07-01 MED ORDER — PREDNISONE 50 MG PO TABS: ORAL_TABLET | ORAL | 0 refills | Status: DC

## 2021-07-01 NOTE — Progress Notes (Signed)
?Thomas Noble - 61 y.o. male MRN [id number]  Date of birth: May 06, 1960 ? ?Subjective ?No chief complaint on file. ? ? ?HPI ?Thomas Noble is a 61 y.o. male here today with complaint of acute on chronic back pain.  Has had back pain flares due to lumbar spondylosis and DDD in the past.  Typically improves with course of prednisone.  Denies increased radicular symptoms, weakness, numbness or tingling into the extremities.   ? ?ROS:  A comprehensive ROS was completed and negative except as noted per HPI ? ?No Known Allergies ? ?Past Medical History:  ?Diagnosis Date  ? Essential hypertension, benign 11/22/2008  ? Qualifier: Diagnosis of  By: [name]    ? Gout 11/22/2008  ? Qualifier: Diagnosis of  By: [name]    ? HLD (hyperlipidemia) 11/22/2008  ? Qualifier: Diagnosis of  By: [name]    ? ? ?No past surgical history on file. ? ?Social History  ? ?Socioeconomic History  ? Marital status: Married  ?  Spouse name: Not on file  ? Number of children: Not on file  ? Years of education: Not on file  ? Highest education level: Not on file  ?Occupational History  ? Not on file  ?Tobacco Use  ? Smoking status: Never  ? Smokeless tobacco: Current  ?  Types: Snuff  ?Vaping Use  ? Vaping Use: Never used  ?Substance and Sexual Activity  ? Alcohol use: Not Currently  ? Drug use: Not on file  ? Sexual activity: Not on file  ?Other Topics Concern  ? Not on file  ?Social History Narrative  ? Not on file  ? ?Social Determinants of Health  ? ?Financial Resource Strain: Not on file  ?Food Insecurity: Not on file  ?Transportation Needs: Not on file  ?Physical Activity: Not on file  ?Stress: Not on file  ?Social Connections: Not on file  ? ? ?No family history on file. ? ?Health Maintenance  ?Topic Date Due  ? HIV Screening  Never done  ? Hepatitis C Screening  Never done  ? COLONOSCOPY (Pts 45-70yrs Insurance coverage will need to be confirmed)  Never done  ? INFLUENZA VACCINE  10/01/2021  ? TETANUS/TDAP  08/14/2022  ?  COVID-19 Vaccine  Completed  ? Zoster Vaccines- Shingrix  Completed  ? HPV VACCINES  Aged Out  ? ? ? ?----------------------------------------------------------------------------------------------------------------------------------------------------------------------------------------------------------------- ?Physical Exam ?BP (!) 150/75 (BP Location: Left Arm, Patient Position: Sitting, Cuff Size: Large)   Pulse 74   Ht 6\' 3"  (1.905 m)   Wt 244 lb (110.7 kg)   SpO2 99%   BMI 30.50 kg/m?  ? ?Physical Exam ?Constitutional:   ?   Appearance: Normal appearance.  ?Eyes:  ?   General: No scleral icterus. ?Cardiovascular:  ?   Rate and Rhythm: Normal rate and regular rhythm.  ?Musculoskeletal:  ?   Cervical back: Neck supple.  ?Neurological:  ?   Mental Status: He is alert.  ?Psychiatric:     ?   Mood and Affect: Mood normal.     ?   Behavior: Behavior normal.  ? ? ?------------------------------------------------------------------------------------------------------------------------------------------------------------------------------------------------------------------- ?Assessment and Plan ? ?Lumbar spondylosis ?Typically responds well to prednisone.  Adding 50mg  daily x5 days.  Recommend gentle stretching with icing and/or heat for comfort.  Contact clinic if not improving after completion of prednisone or for new/worsening symptoms.  ? ? ?Meds ordered this encounter  ?Medications  ? predniSONE (DELTASONE) 50 MG tablet  ?  Sig: Take 1 tab  po daily x5 days  ?  Dispense:  5 tablet  ?  Refill:  0  ? ? ?No follow-ups on file. ? ? ? ?This visit occurred during the SARS-CoV-2 public health emergency.  Safety protocols were in place, including screening questions prior to the visit, additional usage of staff PPE, and extensive cleaning of exam room while observing appropriate contact time as indicated for disinfecting solutions.  ? ?

## 2021-07-01 NOTE — Assessment & Plan Note (Signed)
Typically responds well to prednisone.  Adding 50mg  daily x5 days.  Recommend gentle stretching with icing and/or heat for comfort.  Contact clinic if not improving after completion of prednisone or for new/worsening symptoms.  ?

## 2021-07-22 ENCOUNTER — Encounter: Payer: Self-pay | Admitting: Physician Assistant

## 2021-07-22 LAB — HEMOGLOBIN A1C: Hemoglobin A1C: 5.2

## 2021-07-22 LAB — LIPID PANEL
Cholesterol: 110 (ref 0–200)
HDL: 39 (ref 35–70)
LDL Cholesterol: 45
Triglycerides: 132 (ref 40–160)

## 2021-07-22 LAB — BASIC METABOLIC PANEL: Glucose: 110

## 2021-07-23 LAB — HEMOGLOBIN A1C: Hemoglobin A1C: 5.2

## 2021-07-23 NOTE — Telephone Encounter (Signed)
Labs printed and placed in [facility] basket to sign off on.  Tiajuana Amass, CMA

## 2021-07-24 ENCOUNTER — Encounter: Payer: Self-pay | Admitting: Neurology

## 2021-08-04 ENCOUNTER — Other Ambulatory Visit: Payer: Self-pay | Admitting: Sports Medicine

## 2021-08-04 DIAGNOSIS — M47816 Spondylosis without myelopathy or radiculopathy, lumbar region: Secondary | ICD-10-CM

## 2021-09-30 ENCOUNTER — Other Ambulatory Visit: Payer: Self-pay | Admitting: Physician Assistant

## 2021-09-30 ENCOUNTER — Other Ambulatory Visit: Payer: Self-pay | Admitting: Family Medicine

## 2021-09-30 DIAGNOSIS — M47816 Spondylosis without myelopathy or radiculopathy, lumbar region: Secondary | ICD-10-CM

## 2021-09-30 MED ORDER — CYCLOBENZAPRINE HCL 10 MG PO TABS: 10.0000 mg | ORAL_TABLET | Freq: Three times a day (TID) | ORAL | 0 refills | Status: DC | PRN

## 2021-09-30 NOTE — Telephone Encounter (Signed)
Routing to covering provider -   Please advise if refill is appropriate. Thanks.

## 2021-10-16 IMAGING — DX DG LUMBAR SPINE COMPLETE 4+V
5 series · 5 of 5 positions shown · non-contrast
Comparison: None.

CLINICAL DATA: Low back pain

EXAM:
LUMBAR SPINE - COMPLETE 4+ VIEW

[l-spine ap]
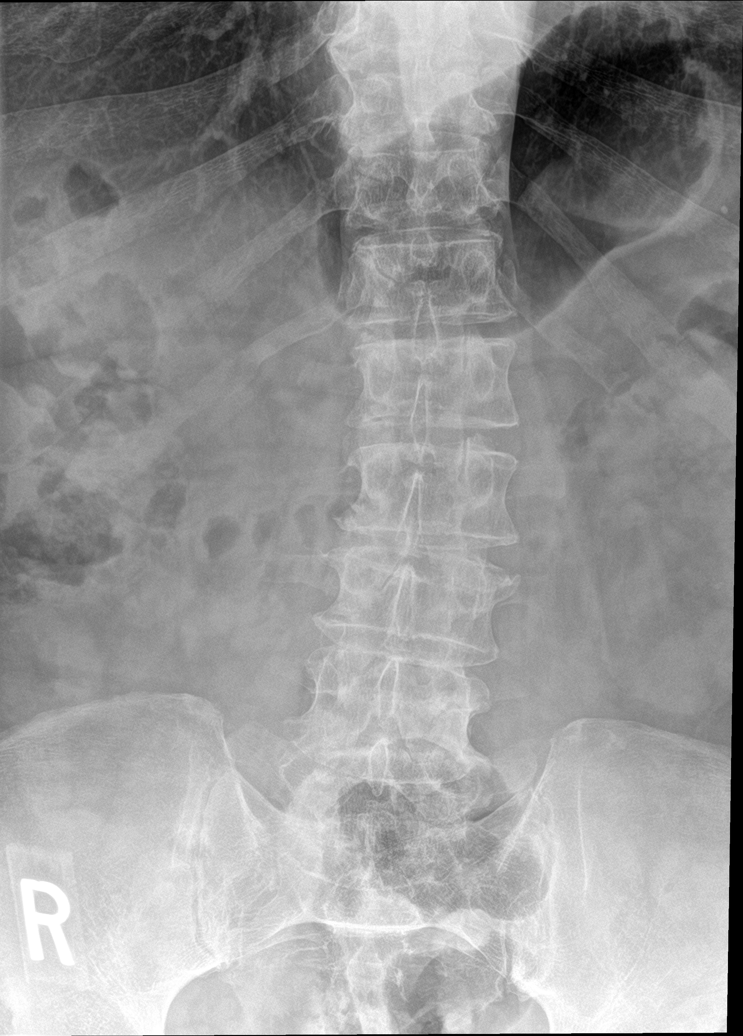

[l-spine obl (1 of 2)]
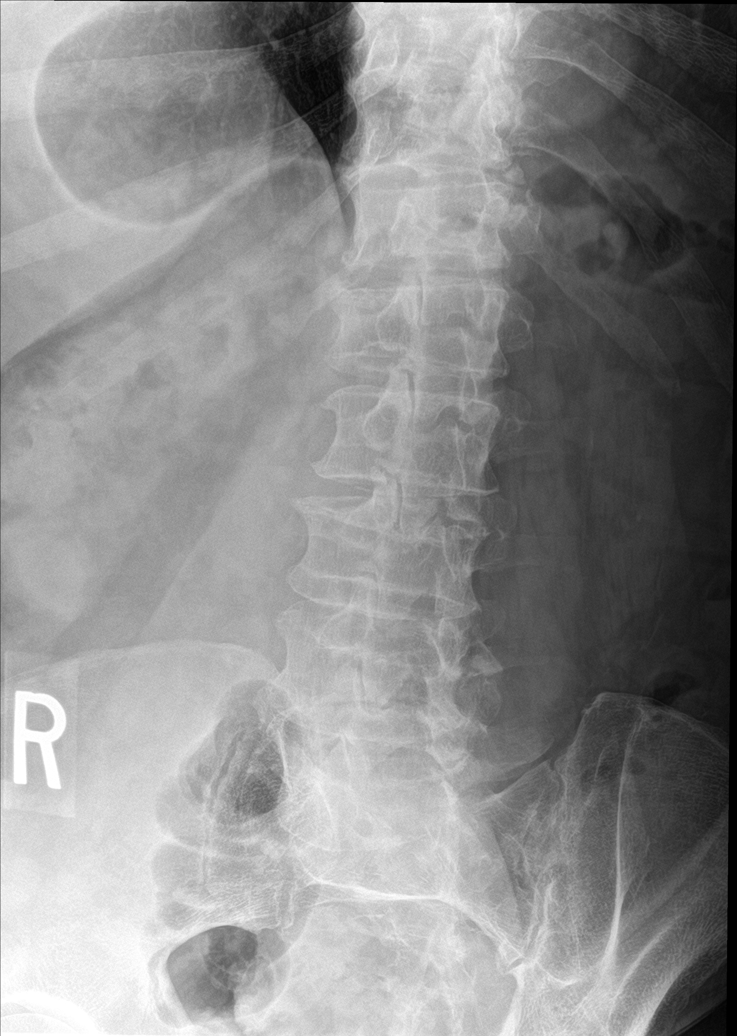

[l-spine obl (2 of 2)]
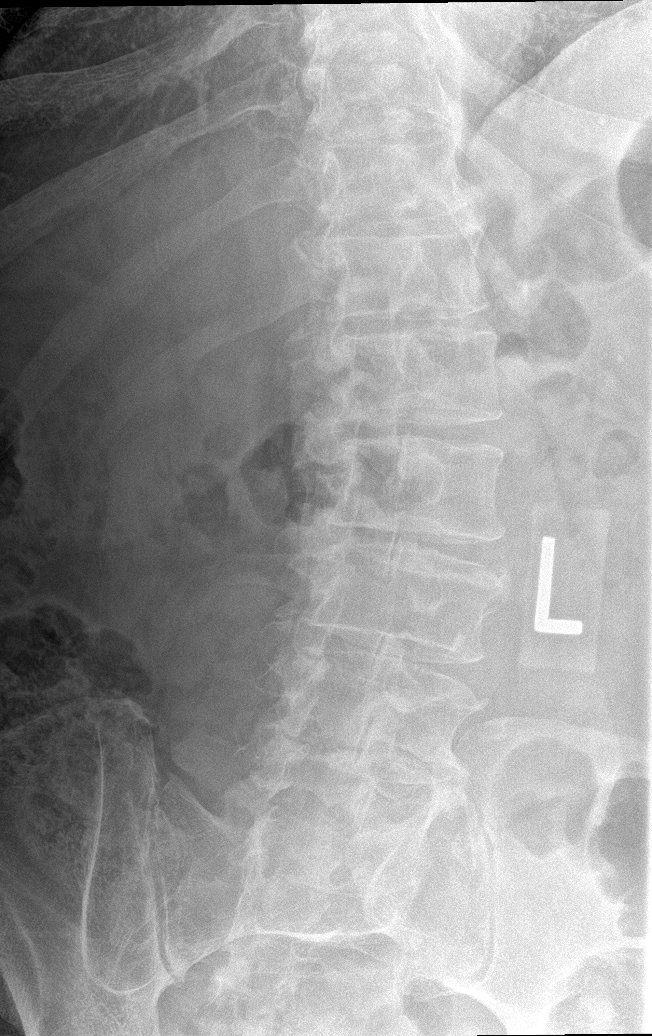

[l-spine lat]
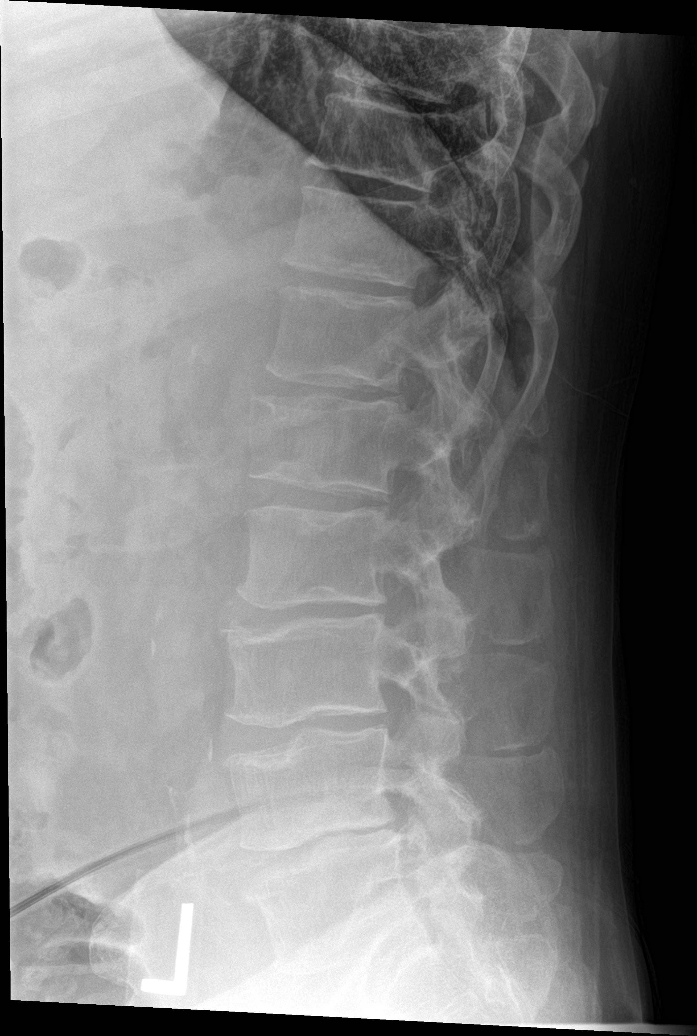

[l-spine spot]
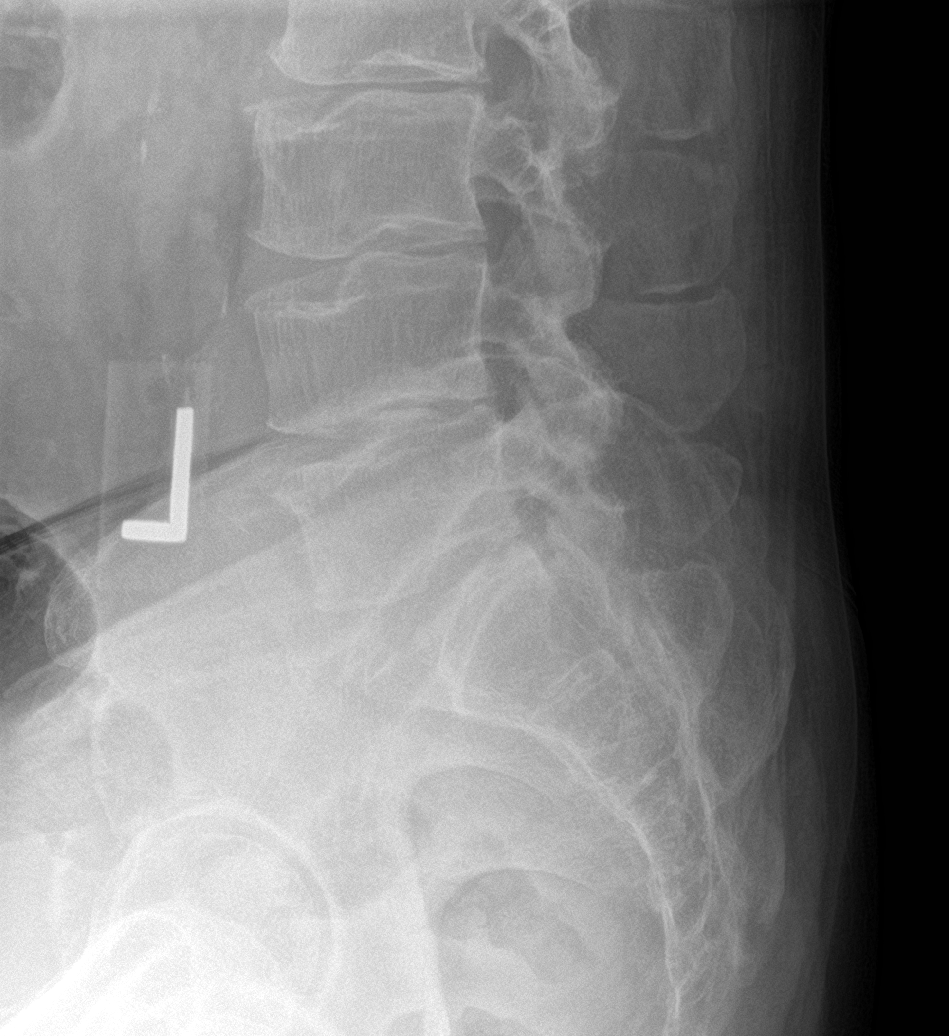

[5 of 5 positions shown; findings below may reference images not displayed]

FINDINGS: Normal lumbar lordosis. No acute fracture or listhesis. There are
remote appearing superior and inferior endplate fractures of L2 and
superior endplate fractures of L3, L4, and L5 with minimal
depression. No significant loss of height. There is diffuse
intervertebral disc space narrowing with mild associated endplate
remodeling throughout the lumbar spine in keeping with changes of
mild diffuse degenerative disc disease. Oblique views demonstrate no
evidence of pars defect. The paraspinal soft tissues are
unremarkable.
IMPRESSION: Multiple remote endplate fractures and diffuse mild degenerative
disc disease involving the lumbar spine. No acute fracture or
listhesis.

## 2021-11-08 ENCOUNTER — Encounter: Payer: Self-pay | Admitting: Physician Assistant

## 2021-11-08 ENCOUNTER — Ambulatory Visit (INDEPENDENT_AMBULATORY_CARE_PROVIDER_SITE_OTHER): Payer: No Typology Code available for payment source | Admitting: Physician Assistant

## 2021-11-08 VITALS — BP 129/78 | HR 91 | Wt 238.0 lb

## 2021-11-08 DIAGNOSIS — Z0000 Encounter for general adult medical examination without abnormal findings: Secondary | ICD-10-CM

## 2021-11-08 DIAGNOSIS — E7800 Pure hypercholesterolemia, unspecified: Secondary | ICD-10-CM | POA: Diagnosis not present

## 2021-11-08 DIAGNOSIS — R7301 Impaired fasting glucose: Secondary | ICD-10-CM

## 2021-11-08 DIAGNOSIS — Z1329 Encounter for screening for other suspected endocrine disorder: Secondary | ICD-10-CM

## 2021-11-08 DIAGNOSIS — Z125 Encounter for screening for malignant neoplasm of prostate: Secondary | ICD-10-CM

## 2021-11-08 DIAGNOSIS — M1A9XX0 Chronic gout, unspecified, without tophus (tophi): Secondary | ICD-10-CM

## 2021-11-08 DIAGNOSIS — M47816 Spondylosis without myelopathy or radiculopathy, lumbar region: Secondary | ICD-10-CM

## 2021-11-08 DIAGNOSIS — I10 Essential (primary) hypertension: Secondary | ICD-10-CM

## 2021-11-08 DIAGNOSIS — Z23 Encounter for immunization: Secondary | ICD-10-CM | POA: Diagnosis not present

## 2021-11-08 MED ORDER — PRAVASTATIN SODIUM 80 MG PO TABS: ORAL_TABLET | ORAL | 3 refills | Status: DC

## 2021-11-08 MED ORDER — CELECOXIB 200 MG PO CAPS: ORAL_CAPSULE | ORAL | 3 refills | Status: DC

## 2021-11-08 MED ORDER — ALLOPURINOL 300 MG PO TABS: 300.0000 mg | ORAL_TABLET | Freq: Every day | ORAL | 2 refills | Status: DC

## 2021-11-08 MED ORDER — CYCLOBENZAPRINE HCL 10 MG PO TABS: 10.0000 mg | ORAL_TABLET | Freq: Three times a day (TID) | ORAL | 0 refills | Status: DC | PRN

## 2021-11-08 NOTE — Progress Notes (Signed)
Complete physical exam  Patient: Thomas Noble   DOB: 1960/03/23   61 y.o. Male  MRN: [id number]  Subjective:    Chief Complaint  Patient presents with   Annual Exam    Thomas Noble is a 61 y.o. male who presents today for a complete physical exam. He reports consuming a general and low fat diet. The patient has a physically strenuous job, but has no regular exercise apart from work.  He generally feels well. He reports sleeping well. He does not have additional problems to discuss today.    Most recent fall risk assessment:    11/08/2021    8:30 AM  Fall Risk   Falls in the past year? 0  Number falls in past yr: 0  Injury with Fall? 0  Risk for fall due to : No Fall Risks  Follow up Falls evaluation completed     Most recent depression screenings:    11/08/2021    8:43 AM 07/01/2021    3:38 PM  PHQ 2/9 Scores  PHQ - 2 Score 0 0  PHQ- 9 Score 1     Vision:Not within last year , Dental: No current dental problems and No regular dental care , and PSA: Agrees to PSA testing  Patient Active Problem List   Diagnosis Date Noted   Onychomycosis 10/08/2020   Sebaceous cyst 10/08/2020   Rash 10/08/2020   Pure hypercholesterolemia 06/20/2016   Elevated fasting glucose 08/26/2015   Lumbar spondylosis 06/14/2014   Gout 11/22/2008   DEPRESSION 11/22/2008   ESSENTIAL HYPERTENSION, BENIGN 11/22/2008   OSTEOPENIA 11/22/2008   ALCOHOL ABUSE, HX OF 11/22/2008   DISC DISEASE, CERVICAL 11/21/2008   Past Medical History:  Diagnosis Date   Essential hypertension, benign 11/22/2008   Qualifier: Diagnosis of  By: [name]     Gout 11/22/2008   Qualifier: Diagnosis of  By: [name]     HLD (hyperlipidemia) 11/22/2008   Qualifier: Diagnosis of  By: [name]     History reviewed. No pertinent family history. No Known Allergies    Patient Care Team: Nolene Ebbs as PCP - General (Family Medicine)   Outpatient Medications Prior to Visit  Medication Sig    amLODipine (NORVASC) 10 MG tablet TAKE 1 TABLET(10 MG) BY MOUTH DAILY.   [DISCONTINUED] allopurinol (ZYLOPRIM) 300 MG tablet Take 1 tablet (300 mg total) by mouth daily.   [DISCONTINUED] celecoxib (CELEBREX) 200 MG capsule TAKE ONE TO TWO CAPSULES BY MOUTH EVERY DAY AS NEEDED FOR PAIN   [DISCONTINUED] cyclobenzaprine (FLEXERIL) 10 MG tablet Take 1 tablet (10 mg total) by mouth 3 (three) times daily as needed for muscle spasms.   [DISCONTINUED] predniSONE (DELTASONE) 50 MG tablet Take 1 tab po daily x5 days   [DISCONTINUED] azelastine (ASTELIN) 0.1 % nasal spray Place 2 sprays into both nostrils 2 (two) times daily. Use in each nostril as directed   [DISCONTINUED] fluticasone (FLONASE) 50 MCG/ACT nasal spray Place 2 sprays into both nostrils daily.   [DISCONTINUED] pravastatin (PRAVACHOL) 80 MG tablet TAKE 1 TABLET (80 MG TOTAL) BY MOUTH DAILY. (Patient not taking: Reported on 11/08/2021)   No facility-administered medications prior to visit.    Review of Systems  All other systems reviewed and are negative.         Objective:     BP 129/78   Pulse 91   Wt 238 lb (108 kg)   SpO2 100%   BMI 29.75 kg/m  BP Readings from  Last 3 Encounters:  11/08/21 129/78  07/01/21 (!) 150/75  01/15/21 (!) 155/79   Wt Readings from Last 3 Encounters:  11/08/21 238 lb (108 kg)  07/01/21 244 lb (110.7 kg)  01/15/21 259 lb 6.4 oz (117.7 kg)      Physical Exam   BP 129/78   Pulse 91   Wt 238 lb (108 kg)   SpO2 100%   BMI 29.75 kg/m   General Appearance:    Alert, cooperative, obese no distress, appears stated age  Head:    Normocephalic, without obvious abnormality, atraumatic  Eyes:    PERRL, conjunctiva/corneas clear, EOM's intact, fundi    benign, both eyes       Ears:    Normal TM's and external ear canals. Some middle ear effusion left TM.  Nose:   Nares normal, septum midline, mucosa normal, no drainage    or sinus tenderness  Throat:   Lips, mucosa, and tongue normal; teeth  and gums normal  Neck:   Supple, symmetrical, trachea midline, no adenopathy;       thyroid:  No enlargement/tenderness/nodules; no carotid   bruit or JVD  Back:     Symmetric, no curvature, ROM normal, no CVA tenderness  Lungs:     Clear to auscultation bilaterally, respirations unlabored  Chest wall:    No tenderness or deformity  Heart:    Regular rate and rhythm, S1 and S2 normal, no murmur, rub   or gallop  Abdomen:     Soft, non-tender, bowel sounds active all four quadrants,    no masses, no organomegaly, ventral hernia        Extremities:   Extremities normal, atraumatic, no cyanosis or edema  Pulses:   2+ and symmetric all extremities  Skin:   Skin color, texture, turgor normal, no rashes or lesions  Lymph nodes:   Cervical, supraclavicular, and axillary nodes normal  Neurologic:   CNII-XII intact. Normal strength, sensation and reflexes      throughout      Assessment & Plan:    Routine Health Maintenance and Physical Exam  Immunization History  Administered Date(s) Administered   DTaP 12/07/2002   Influenza Split 12/02/2015   Influenza Whole 11/29/2008   Influenza,inj,Quad PF,6+ Mos 01/02/2018, 01/02/2018, 11/08/2021   Influenza-Unspecified 01/05/2003, 01/04/2004, 01/06/2005, 12/13/2005, 12/02/2007, 11/01/2008, 01/02/2011, 12/21/2016, 12/02/2018, 12/16/2019, 12/08/2020   Janssen (J&J) SARS-COV-2 Vaccination 05/15/2019   Moderna Sars-Covid-2 Vaccination 07/20/2020   PFIZER(Purple Top)SARS-COV-2 Vaccination 12/23/2019, 12/08/2020   Pneumococcal Polysaccharide-23 10/05/2020   Td (Adult),unspecified 12/07/2002   Tdap 03/03/2012, 08/13/2012   Zoster Recombinat (Shingrix) 11/16/2019, 10/05/2020    Health Maintenance  Topic Date Due   COVID-19 Vaccine (5 - Booster for Janssen series) 02/02/2021   COLONOSCOPY (Pts 45-69yrs Insurance coverage will need to be confirmed)  07/02/2022 (Originally 04/28/2004)   Hepatitis C Screening  07/02/2022 (Originally 04/28/1977)   HIV  Screening  07/02/2022 (Originally 04/28/1974)   TETANUS/TDAP  08/14/2022   INFLUENZA VACCINE  Completed   Zoster Vaccines- Shingrix  Completed   HPV VACCINES  Aged Out    Discussed health benefits of physical activity, and encouraged him to engage in regular exercise appropriate for his age and condition.  Marland KitchenJomarie Noble was seen today for annual exam.  Diagnoses and all orders for this visit:  Routine physical examination -     PSA -     TSH -     Lipid Panel w/reflex Direct LDL -     COMPLETE METABOLIC PANEL WITH GFR -  CBC with Differential/Platelet -     Hemoglobin A1c  Essential hypertension, benign -     COMPLETE METABOLIC PANEL WITH GFR  Pure hypercholesterolemia -     Lipid Panel w/reflex Direct LDL -     pravastatin (PRAVACHOL) 80 MG tablet; TAKE 1 TABLET (80 MG TOTAL) BY MOUTH DAILY.  Prostate cancer screening -     PSA  Elevated fasting glucose -     Hemoglobin A1c  Thyroid disorder screen -     TSH  Chronic gout without tophus, unspecified cause, unspecified site -     Uric acid -     allopurinol (ZYLOPRIM) 300 MG tablet; Take 1 tablet (300 mg total) by mouth daily.  Lumbar spondylosis -     celecoxib (CELEBREX) 200 MG capsule; TAKE ONE TO TWO CAPSULES BY MOUTH EVERY DAY AS NEEDED FOR PAIN -     cyclobenzaprine (FLEXERIL) 10 MG tablet; Take 1 tablet (10 mg total) by mouth 3 (three) times daily as needed for muscle spasms.  Needs flu shot -     Flu Vaccine QUAD 56mo+IM (Fluarix, Fluzone & Alfiuria Quad PF)   .Marland KitchenStart a regular exercise program and make sure you are eating a healthy diet Try to eat 4 servings of dairy a day or take a calcium supplement (500mg  twice a day). Flu shot given today Shingrix UTD Recommended covid booster Encouraged annual eye and dental care Declined colonoscopy-pt aware of risk of not screening Declined cologuard as well Reviewed fasting labs PSA and uric acid ordered BP looks great-meds refilled     ,  PA-C

## 2021-11-08 NOTE — Patient Instructions (Signed)
Health Maintenance, Male Adopting a healthy lifestyle and getting preventive care are important in promoting health and wellness. Ask your health care provider about: The right schedule for you to have regular tests and exams. Things you can do on your own to prevent diseases and keep yourself healthy. What should I know about diet, weight, and exercise? Eat a healthy diet  Eat a diet that includes plenty of vegetables, fruits, low-fat dairy products, and lean protein. Do not eat a lot of foods that are high in solid fats, added sugars, or sodium. Maintain a healthy weight Body mass index (BMI) is a measurement that can be used to identify possible weight problems. It estimates body fat based on height and weight. Your health care provider can help determine your BMI and help you achieve or maintain a healthy weight. Get regular exercise Get regular exercise. This is one of the most important things you can do for your health. Most adults should: Exercise for at least 150 minutes each week. The exercise should increase your heart rate and make you sweat (moderate-intensity exercise). Do strengthening exercises at least twice a week. This is in addition to the moderate-intensity exercise. Spend less time sitting. Even light physical activity can be beneficial. Watch cholesterol and blood lipids Have your blood tested for lipids and cholesterol at 62 years of age, then have this test every 5 years. You may need to have your cholesterol levels checked more often if: Your lipid or cholesterol levels are high. You are older than 62 years of age. You are at high risk for heart disease. What should I know about cancer screening? Many types of cancers can be detected early and may often be prevented. Depending on your health history and family history, you may need to have cancer screening at various ages. This may include screening for: Colorectal cancer. Prostate cancer. Skin cancer. Lung  cancer. What should I know about heart disease, diabetes, and high blood pressure? Blood pressure and heart disease High blood pressure causes heart disease and increases the risk of stroke. This is more likely to develop in people who have high blood pressure readings or are overweight. Talk with your health care provider about your target blood pressure readings. Have your blood pressure checked: Every 3-5 years if you are 18-39 years of age. Every year if you are 40 years old or older. If you are between the ages of 65 and 75 and are a current or former smoker, ask your health care provider if you should have a one-time screening for abdominal aortic aneurysm (AAA). Diabetes Have regular diabetes screenings. This checks your fasting blood sugar level. Have the screening done: Once every three years after age 45 if you are at a normal weight and have a low risk for diabetes. More often and at a younger age if you are overweight or have a high risk for diabetes. What should I know about preventing infection? Hepatitis B If you have a higher risk for hepatitis B, you should be screened for this virus. Talk with your health care provider to find out if you are at risk for hepatitis B infection. Hepatitis C Blood testing is recommended for: Everyone born from 1945 through 1965. Anyone with known risk factors for hepatitis C. Sexually transmitted infections (STIs) You should be screened each year for STIs, including gonorrhea and chlamydia, if: You are sexually active and are younger than 62 years of age. You are older than 62 years of age and your   health care provider tells you that you are at risk for this type of infection. Your sexual activity has changed since you were last screened, and you are at increased risk for chlamydia or gonorrhea. Ask your health care provider if you are at risk. Ask your health care provider about whether you are at high risk for HIV. Your health care provider  may recommend a prescription medicine to help prevent HIV infection. If you choose to take medicine to prevent HIV, you should first get tested for HIV. You should then be tested every 3 months for as long as you are taking the medicine. Follow these instructions at home: Alcohol use Do not drink alcohol if your health care provider tells you not to drink. If you drink alcohol: Limit how much you have to 0-2 drinks a day. Know how much alcohol is in your drink. In the [place] one drink equals one 12 oz bottle of beer (355 mL), one 5 oz glass of wine (148 mL), or one 1 oz glass of hard liquor (44 mL). Lifestyle Do not use any products that contain nicotine or tobacco. These products include cigarettes, chewing tobacco, and vaping devices, such as e-cigarettes. If you need help quitting, ask your health care provider. Do not use street drugs. Do not share needles. Ask your health care provider for help if you need support or information about quitting drugs. General instructions Schedule regular health, dental, and eye exams. Stay current with your vaccines. Tell your health care provider if: You often feel depressed. You have ever been abused or do not feel safe at home. Summary Adopting a healthy lifestyle and getting preventive care are important in promoting health and wellness. Follow your health care provider's instructions about healthy diet, exercising, and getting tested or screened for diseases. Follow your health care provider's instructions on monitoring your cholesterol and blood pressure. This information is not intended to replace advice given to you by your health care provider. Make sure you discuss any questions you have with your health care provider. Document Revised: 07/09/2020 Document Reviewed: 07/09/2020 Elsevier Patient Education  2023 Elsevier Inc.  

## 2021-11-09 LAB — COMPLETE METABOLIC PANEL WITH GFR
AG Ratio: 1.9 (calc) (ref 1.0–2.5)
ALT: 18 U/L (ref 9–46)
AST: 14 U/L (ref 10–35)
Albumin: 5.2 g/dL — ABNORMAL HIGH (ref 3.6–5.1)
Alkaline phosphatase (APISO): 100 U/L (ref 35–144)
BUN: 16 mg/dL (ref 7–25)
CO2: 26 mmol/L (ref 20–32)
Calcium: 10.5 mg/dL — ABNORMAL HIGH (ref 8.6–10.3)
Chloride: 102 mmol/L (ref 98–110)
Creat: 1.03 mg/dL (ref 0.70–1.35)
Globulin: 2.7 g/dL (calc) (ref 1.9–3.7)
Glucose, Bld: 101 mg/dL — ABNORMAL HIGH (ref 65–99)
Potassium: 4.8 mmol/L (ref 3.5–5.3)
Sodium: 140 mmol/L (ref 135–146)
Total Bilirubin: 0.6 mg/dL (ref 0.2–1.2)
Total Protein: 7.9 g/dL (ref 6.1–8.1)
eGFR: 82 mL/min/{1.73_m2} (ref >=60)

## 2021-11-09 LAB — CBC WITH DIFFERENTIAL/PLATELET
Absolute Monocytes: 619 cells/uL (ref 200–950)
Basophils Absolute: 72 cells/uL (ref 0–200)
Basophils Relative: 1 %
Eosinophils Absolute: 259 cells/uL (ref 15–500)
Eosinophils Relative: 3.6 %
HCT: 47.3 % (ref 38.5–50.0)
Hemoglobin: 16.4 g/dL (ref 13.2–17.1)
Lymphs Abs: 2102 cells/uL (ref 850–3900)
MCH: 32.1 pg (ref 27.0–33.0)
MCHC: 34.7 g/dL (ref 32.0–36.0)
MCV: 92.6 fL (ref 80.0–100.0)
MPV: 11.1 fL (ref 7.5–12.5)
Monocytes Relative: 8.6 %
Neutro Abs: 4147 cells/uL (ref 1500–7800)
Neutrophils Relative %: 57.6 %
Platelets: 244 10*3/uL (ref 140–400)
RBC: 5.11 10*6/uL (ref 4.20–5.80)
RDW: 12.7 % (ref 11.0–15.0)
Total Lymphocyte: 29.2 %
WBC: 7.2 10*3/uL (ref 3.8–10.8)

## 2021-11-09 LAB — URIC ACID: Uric Acid, Serum: 5.3 mg/dL (ref 4.0–8.0)

## 2021-11-09 LAB — TSH: TSH: 1.3 mIU/L (ref 0.40–4.50)

## 2021-11-09 LAB — PSA: PSA: 1.34 ng/mL (ref <=4.00)

## 2021-11-11 ENCOUNTER — Encounter: Payer: Self-pay | Admitting: Physician Assistant

## 2021-11-11 DIAGNOSIS — R7989 Other specified abnormal findings of blood chemistry: Secondary | ICD-10-CM | POA: Insufficient documentation

## 2021-11-11 NOTE — Progress Notes (Signed)
[name],   Uric acid to goal.  Thyroid looks great.  WBC normal, hemoglobin normal.  PSA looks great.  Kidney and liver look great.  Your calcium is just a tad elevated as well as your albumin(protein).  If you take any extra calcium can stop.  I suggest adding D3 1000 units to help you get more calcium in your bones.  Lets recheck labs in 3 months to make sure not trending up.

## 2021-11-18 ENCOUNTER — Encounter: Payer: Self-pay | Admitting: Sports Medicine

## 2021-11-18 ENCOUNTER — Ambulatory Visit: Payer: No Typology Code available for payment source | Admitting: Sports Medicine

## 2021-11-18 DIAGNOSIS — M47816 Spondylosis without myelopathy or radiculopathy, lumbar region: Secondary | ICD-10-CM

## 2021-11-18 NOTE — Assessment & Plan Note (Signed)
Pleasant 61 year old male, stable lumbar DDD, he gets occasional burst of prednisone for pain, only a couple of times per year, we filled out his FMLA paperwork today for DDD, duration is lifelong, FMLA was for reduced schedule with flares approximately 4 times every 6 months needing 7 days off for each flare. These were scanned into his chart and he plans to hand-delivered the FMLA paperwork himself. Return to see me as needed.

## 2021-11-18 NOTE — Progress Notes (Signed)
    Procedures performed today:    None.  Independent interpretation of notes and tests performed by another provider:   None.  Brief History, Exam, Impression, and Recommendations:    Lumbar spondylosis Pleasant 61 year old male, stable lumbar DDD, he gets occasional burst of prednisone for pain, only a couple of times per year, we filled out his FMLA paperwork today for DDD, duration is lifelong, FMLA was for reduced schedule with flares approximately 4 times every 6 months needing 7 days off for each flare. These were scanned into his chart and he plans to hand-delivered the FMLA paperwork himself. Return to see me as needed.    ____________________________________________ Gwen Her. Dianah Field, M.D., ABFM., CAQSM., AME. Primary Care and Sports Medicine [facility]  Adjunct Professor of [employer]  Risk manager

## 2021-11-19 ENCOUNTER — Encounter: Payer: Self-pay | Admitting: Sports Medicine

## 2021-11-20 MED ORDER — PREDNISONE 50 MG PO TABS: ORAL_TABLET | ORAL | 0 refills | Status: DC

## 2021-12-12 ENCOUNTER — Encounter: Payer: Self-pay | Admitting: Sports Medicine

## 2021-12-12 DIAGNOSIS — M5030 Other cervical disc degeneration, unspecified cervical region: Secondary | ICD-10-CM

## 2021-12-12 DIAGNOSIS — M47816 Spondylosis without myelopathy or radiculopathy, lumbar region: Secondary | ICD-10-CM | POA: Diagnosis not present

## 2021-12-13 NOTE — Telephone Encounter (Signed)
I spent 5 total minutes of online digital evaluation and management services in this patient-initiated request for online care. 

## 2021-12-23 ENCOUNTER — Encounter: Payer: Self-pay | Admitting: Physician Assistant

## 2022-01-31 ENCOUNTER — Encounter: Payer: Self-pay | Admitting: Sports Medicine

## 2022-02-03 NOTE — Telephone Encounter (Signed)
All mychart messages have a charge, patient can take it up with insurance company or make appointments for complex FMLA paperwork.[name]

## 2022-02-20 ENCOUNTER — Other Ambulatory Visit: Payer: Self-pay | Admitting: Physician Assistant

## 2022-02-20 ENCOUNTER — Encounter: Payer: Self-pay | Admitting: Physician Assistant

## 2022-02-20 DIAGNOSIS — I10 Essential (primary) hypertension: Secondary | ICD-10-CM

## 2022-03-05 ENCOUNTER — Other Ambulatory Visit: Payer: Self-pay | Admitting: Physician Assistant

## 2022-03-05 DIAGNOSIS — M47816 Spondylosis without myelopathy or radiculopathy, lumbar region: Secondary | ICD-10-CM

## 2022-04-01 ENCOUNTER — Encounter: Payer: Self-pay | Admitting: Physician Assistant

## 2022-04-03 ENCOUNTER — Encounter: Payer: Self-pay | Admitting: Physician Assistant

## 2022-04-03 ENCOUNTER — Other Ambulatory Visit: Payer: Self-pay

## 2022-04-03 ENCOUNTER — Encounter (INDEPENDENT_AMBULATORY_CARE_PROVIDER_SITE_OTHER): Payer: No Typology Code available for payment source | Admitting: Sports Medicine

## 2022-04-03 ENCOUNTER — Other Ambulatory Visit: Payer: Self-pay | Admitting: Sports Medicine

## 2022-04-03 DIAGNOSIS — M47816 Spondylosis without myelopathy or radiculopathy, lumbar region: Secondary | ICD-10-CM

## 2022-04-03 MED ORDER — CYCLOBENZAPRINE HCL 10 MG PO TABS: 10.0000 mg | ORAL_TABLET | Freq: Three times a day (TID) | ORAL | 0 refills | Status: DC | PRN

## 2022-04-03 MED ORDER — PREDNISONE 50 MG PO TABS: ORAL_TABLET | ORAL | 0 refills | Status: DC

## 2022-04-03 NOTE — Telephone Encounter (Signed)
I spent 5 total minutes of online digital evaluation and management services in this patient-initiated request for online care. 

## 2022-05-09 ENCOUNTER — Ambulatory Visit: Payer: PRIVATE HEALTH INSURANCE | Admitting: Physician Assistant

## 2022-05-26 ENCOUNTER — Ambulatory Visit: Payer: No Typology Code available for payment source | Admitting: Family Medicine

## 2022-05-26 ENCOUNTER — Encounter: Payer: Self-pay | Admitting: Family Medicine

## 2022-05-26 VITALS — BP 140/82 | HR 92 | Temp 99.1°F | Ht 74.0 in | Wt 220.0 lb

## 2022-05-26 DIAGNOSIS — R0981 Nasal congestion: Secondary | ICD-10-CM | POA: Diagnosis not present

## 2022-05-26 DIAGNOSIS — R051 Acute cough: Secondary | ICD-10-CM

## 2022-05-26 NOTE — Assessment & Plan Note (Signed)
Patient had negative COVID test at home and did not want a flu test today.  Discussed that this is most likely a viral infection.  Did discuss supportive treatment including fluids, rest, and allergy medication.

## 2022-05-26 NOTE — Assessment & Plan Note (Signed)
-  Recommended over-the-counter treatment as well as daily allergy medication.

## 2022-05-26 NOTE — Progress Notes (Signed)
   Acute Office Visit  Subjective:     Patient ID: Thomas Noble, male    DOB: 24-May-1960, 62 y.o.   MRN: [id number]  Chief Complaint  Patient presents with   Sore Throat    HPI Patient is in today for sore throat.  He said symptoms started 2 days ago, but yesterday he started to feel better.  He admits to some congestion that has been improving.  He took a home COVID test this morning that was negative.  He has been using Tylenol for symptom control.  Review of Systems  Constitutional:  Negative for chills and fever.  HENT:  Positive for congestion.   Respiratory:  Negative for cough and shortness of breath.   Cardiovascular:  Negative for chest pain.  Neurological:  Negative for headaches.        Objective:    BP (!) 140/82   Pulse 92   Temp 99.1 F (37.3 C) (Oral)   Ht 6\' 2"  (1.88 m)   Wt 220 lb (99.8 kg)   SpO2 99%   BMI 28.25 kg/m    Physical Exam Vitals and nursing note reviewed.  Constitutional:      General: He is not in acute distress.    Appearance: Normal appearance.  HENT:     Head: Normocephalic and atraumatic.     Right Ear: External ear normal.     Left Ear: External ear normal.     Nose: Nose normal.     Mouth/Throat:     Tonsils: No tonsillar exudate.  Eyes:     Conjunctiva/sclera: Conjunctivae normal.  Cardiovascular:     Rate and Rhythm: Normal rate and regular rhythm.  Pulmonary:     Effort: Pulmonary effort is normal.     Breath sounds: Normal breath sounds.  Neurological:     General: No focal deficit present.     Mental Status: He is alert and oriented to person, place, and time.  Psychiatric:        Mood and Affect: Mood normal.        Behavior: Behavior normal.        Thought Content: Thought content normal.        Judgment: Judgment normal.     No results found for any visits on 05/26/22.      Assessment & Plan:   Problem List Items Addressed This Visit       Respiratory   Congestion of nasal sinus    -Recommended  over-the-counter treatment as well as daily allergy medication.        Other   Acute cough - Primary    Patient had negative COVID test at home and did not want a flu test today.  Discussed that this is most likely a viral infection.  Did discuss supportive treatment including fluids, rest, and allergy medication.      Patient given work excuse to return to work in 2 days. No orders of the defined types were placed in this encounter.   No follow-ups on file.  Owens Loffler, DO

## 2022-08-04 LAB — BASIC METABOLIC PANEL: Glucose: 107

## 2022-08-04 LAB — LIPID PANEL
Cholesterol: 210 — AB (ref 0–200)
HDL: 62 (ref 35–70)
LDL Cholesterol: 114
Triglycerides: 166 — AB (ref 40–160)

## 2022-08-04 LAB — HEMOGLOBIN A1C: Hemoglobin A1C: 5.8

## 2022-08-05 ENCOUNTER — Encounter: Payer: Self-pay | Admitting: Physician Assistant

## 2022-08-06 NOTE — Telephone Encounter (Signed)
Placed in basket to abstract.  

## 2022-08-20 ENCOUNTER — Encounter: Payer: Self-pay | Admitting: Physician Assistant

## 2022-08-21 ENCOUNTER — Other Ambulatory Visit: Payer: Self-pay | Admitting: Physician Assistant

## 2022-08-21 DIAGNOSIS — M1A9XX0 Chronic gout, unspecified, without tophus (tophi): Secondary | ICD-10-CM

## 2022-10-23 ENCOUNTER — Encounter: Payer: Self-pay | Admitting: Physician Assistant

## 2022-10-24 ENCOUNTER — Encounter: Payer: Self-pay | Admitting: Physician Assistant

## 2022-10-24 ENCOUNTER — Ambulatory Visit (INDEPENDENT_AMBULATORY_CARE_PROVIDER_SITE_OTHER): Payer: No Typology Code available for payment source | Admitting: Physician Assistant

## 2022-10-24 VITALS — BP 140/70 | HR 71 | Ht 75.0 in | Wt 251.0 lb

## 2022-10-24 DIAGNOSIS — I10 Essential (primary) hypertension: Secondary | ICD-10-CM

## 2022-10-24 DIAGNOSIS — E7800 Pure hypercholesterolemia, unspecified: Secondary | ICD-10-CM | POA: Diagnosis not present

## 2022-10-24 DIAGNOSIS — M1A9XX0 Chronic gout, unspecified, without tophus (tophi): Secondary | ICD-10-CM

## 2022-10-24 DIAGNOSIS — Z0000 Encounter for general adult medical examination without abnormal findings: Secondary | ICD-10-CM

## 2022-10-24 DIAGNOSIS — Z125 Encounter for screening for malignant neoplasm of prostate: Secondary | ICD-10-CM

## 2022-10-24 DIAGNOSIS — R7301 Impaired fasting glucose: Secondary | ICD-10-CM

## 2022-10-24 DIAGNOSIS — M47816 Spondylosis without myelopathy or radiculopathy, lumbar region: Secondary | ICD-10-CM

## 2022-10-24 DIAGNOSIS — Z23 Encounter for immunization: Secondary | ICD-10-CM | POA: Diagnosis not present

## 2022-10-24 MED ORDER — CELECOXIB 200 MG PO CAPS
ORAL_CAPSULE | ORAL | 3 refills | Status: DC
Start: 2022-10-24 — End: 2023-11-27

## 2022-10-24 MED ORDER — AMLODIPINE BESYLATE 10 MG PO TABS
ORAL_TABLET | ORAL | 3 refills | Status: DC
Start: 2022-10-24 — End: 2023-11-27

## 2022-10-24 MED ORDER — PRAVASTATIN SODIUM 80 MG PO TABS
ORAL_TABLET | ORAL | 3 refills | Status: DC
Start: 2022-10-24 — End: 2023-11-27

## 2022-10-24 NOTE — Patient Instructions (Signed)
Health Maintenance, Male Adopting a healthy lifestyle and getting preventive care are important in promoting health and wellness. Ask your health care provider about: The right schedule for you to have regular tests and exams. Things you can do on your own to prevent diseases and keep yourself healthy. What should I know about diet, weight, and exercise? Eat a healthy diet  Eat a diet that includes plenty of vegetables, fruits, low-fat dairy products, and lean protein. Do not eat a lot of foods that are high in solid fats, added sugars, or sodium. Maintain a healthy weight Body mass index (BMI) is a measurement that can be used to identify possible weight problems. It estimates body fat based on height and weight. Your health care provider can help determine your BMI and help you achieve or maintain a healthy weight. Get regular exercise Get regular exercise. This is one of the most important things you can do for your health. Most adults should: Exercise for at least 150 minutes each week. The exercise should increase your heart rate and make you sweat (moderate-intensity exercise). Do strengthening exercises at least twice a week. This is in addition to the moderate-intensity exercise. Spend less time sitting. Even light physical activity can be beneficial. Watch cholesterol and blood lipids Have your blood tested for lipids and cholesterol at 63 years of age, then have this test every 5 years. You may need to have your cholesterol levels checked more often if: Your lipid or cholesterol levels are high. You are older than 63 years of age. You are at high risk for heart disease. What should I know about cancer screening? Many types of cancers can be detected early and may often be prevented. Depending on your health history and family history, you may need to have cancer screening at various ages. This may include screening for: Colorectal cancer. Prostate cancer. Skin cancer. Lung  cancer. What should I know about heart disease, diabetes, and high blood pressure? Blood pressure and heart disease High blood pressure causes heart disease and increases the risk of stroke. This is more likely to develop in people who have high blood pressure readings or are overweight. Talk with your health care provider about your target blood pressure readings. Have your blood pressure checked: Every 3-5 years if you are 18-39 years of age. Every year if you are 40 years old or older. If you are between the ages of 65 and 75 and are a current or former smoker, ask your health care provider if you should have a one-time screening for abdominal aortic aneurysm (AAA). Diabetes Have regular diabetes screenings. This checks your fasting blood sugar level. Have the screening done: Once every three years after age 45 if you are at a normal weight and have a low risk for diabetes. More often and at a younger age if you are overweight or have a high risk for diabetes. What should I know about preventing infection? Hepatitis B If you have a higher risk for hepatitis B, you should be screened for this virus. Talk with your health care provider to find out if you are at risk for hepatitis B infection. Hepatitis C Blood testing is recommended for: Everyone born from 1945 through 1965. Anyone with known risk factors for hepatitis C. Sexually transmitted infections (STIs) You should be screened each year for STIs, including gonorrhea and chlamydia, if: You are sexually active and are younger than 63 years of age. You are older than 63 years of age and your   health care provider tells you that you are at risk for this type of infection. Your sexual activity has changed since you were last screened, and you are at increased risk for chlamydia or gonorrhea. Ask your health care provider if you are at risk. Ask your health care provider about whether you are at high risk for HIV. Your health care provider  may recommend a prescription medicine to help prevent HIV infection. If you choose to take medicine to prevent HIV, you should first get tested for HIV. You should then be tested every 3 months for as long as you are taking the medicine. Follow these instructions at home: Alcohol use Do not drink alcohol if your health care provider tells you not to drink. If you drink alcohol: Limit how much you have to 0-2 drinks a day. Know how much alcohol is in your drink. In the [place] one drink equals one 12 oz bottle of beer (355 mL), one 5 oz glass of wine (148 mL), or one 1 oz glass of hard liquor (44 mL). Lifestyle Do not use any products that contain nicotine or tobacco. These products include cigarettes, chewing tobacco, and vaping devices, such as e-cigarettes. If you need help quitting, ask your health care provider. Do not use street drugs. Do not share needles. Ask your health care provider for help if you need support or information about quitting drugs. General instructions Schedule regular health, dental, and eye exams. Stay current with your vaccines. Tell your health care provider if: You often feel depressed. You have ever been abused or do not feel safe at home. Summary Adopting a healthy lifestyle and getting preventive care are important in promoting health and wellness. Follow your health care provider's instructions about healthy diet, exercising, and getting tested or screened for diseases. Follow your health care provider's instructions on monitoring your cholesterol and blood pressure. This information is not intended to replace advice given to you by your health care provider. Make sure you discuss any questions you have with your health care provider. Document Revised: 07/09/2020 Document Reviewed: 07/09/2020 Elsevier Patient Education  2024 Elsevier Inc.  

## 2022-10-25 LAB — CBC WITH DIFFERENTIAL/PLATELET
Basophils Absolute: 0.1 10*3/uL (ref 0.0–0.2)
Basos: 1 %
EOS (ABSOLUTE): 0.2 10*3/uL (ref 0.0–0.4)
Eos: 4 %
Hematocrit: 46 % (ref 37.5–51.0)
Hemoglobin: 15.9 g/dL (ref 13.0–17.7)
Immature Grans (Abs): 0 10*3/uL (ref 0.0–0.1)
Immature Granulocytes: 0 %
Lymphocytes Absolute: 1.6 10*3/uL (ref 0.7–3.1)
Lymphs: 28 %
MCH: 32.1 pg (ref 26.6–33.0)
MCHC: 34.6 g/dL (ref 31.5–35.7)
MCV: 93 fL (ref 79–97)
Monocytes Absolute: 0.5 10*3/uL (ref 0.1–0.9)
Monocytes: 8 %
Neutrophils Absolute: 3.5 10*3/uL (ref 1.4–7.0)
Neutrophils: 59 %
Platelets: 194 10*3/uL (ref 150–450)
RBC: 4.96 x10E6/uL (ref 4.14–5.80)
RDW: 13 % (ref 11.6–15.4)
WBC: 6 10*3/uL (ref 3.4–10.8)

## 2022-10-25 LAB — CMP14+EGFR
ALT: 23 IU/L (ref 0–44)
AST: 15 IU/L (ref 0–40)
Albumin: 4.9 g/dL (ref 3.9–4.9)
Alkaline Phosphatase: 119 IU/L (ref 44–121)
BUN/Creatinine Ratio: 11 (ref 10–24)
BUN: 8 mg/dL (ref 8–27)
Bilirubin Total: 0.4 mg/dL (ref 0.0–1.2)
CO2: 18 mmol/L — ABNORMAL LOW (ref 20–29)
Calcium: 9.6 mg/dL (ref 8.6–10.2)
Chloride: 99 mmol/L (ref 96–106)
Creatinine, Ser: 0.72 mg/dL — ABNORMAL LOW (ref 0.76–1.27)
Globulin, Total: 2.5 g/dL (ref 1.5–4.5)
Glucose: 81 mg/dL (ref 70–99)
Potassium: 4 mmol/L (ref 3.5–5.2)
Sodium: 137 mmol/L (ref 134–144)
Total Protein: 7.4 g/dL (ref 6.0–8.5)
eGFR: 103 mL/min/{1.73_m2} (ref >59)

## 2022-10-25 LAB — LIPID PANEL
Chol/HDL Ratio: 4.5 ratio (ref 0.0–5.0)
Cholesterol, Total: 198 mg/dL (ref 100–199)
HDL: 44 mg/dL (ref >39)
LDL Chol Calc (NIH): 123 mg/dL — ABNORMAL HIGH (ref 0–99)
Triglycerides: 173 mg/dL — ABNORMAL HIGH (ref 0–149)
VLDL Cholesterol Cal: 31 mg/dL (ref 5–40)

## 2022-10-25 LAB — HEMOGLOBIN A1C
Est. average glucose Bld gHb Est-mCnc: 123 mg/dL
Hgb A1c MFr Bld: 5.9 % — ABNORMAL HIGH (ref 4.8–5.6)

## 2022-10-25 LAB — URIC ACID: Uric Acid: 4.7 mg/dL (ref 3.8–8.4)

## 2022-10-25 LAB — PSA: Prostate Specific Ag, Serum: 2.9 ng/mL (ref 0.0–4.0)

## 2022-10-27 ENCOUNTER — Encounter: Payer: Self-pay | Admitting: Physician Assistant

## 2022-10-27 DIAGNOSIS — R7303 Prediabetes: Secondary | ICD-10-CM | POA: Insufficient documentation

## 2022-10-27 NOTE — Progress Notes (Signed)
Thomas Noble,   Uric acid normal range.  PSA normal.  CBC normal.  A1C in pre-diabetes. Start monitoring how much sugars and carbs you are eating. Recheck in 6 months.   LDL not to goal. 10 year CV risk is 15.8 percent. You are on pravastatin. Thoughts on changing to a high intensity statin?   Marland KitchenMarland KitchenThe 10-year ASCVD risk score (Arnett DK, et al., 2019) is: 15.8%   Values used to calculate the score:     Age: 62 years     Sex: Male     Is Non-Hispanic African American: No     Diabetic: No     Tobacco smoker: No     Systolic Blood Pressure: 140 mmHg     Is BP treated: Yes     HDL Cholesterol: 44 mg/dL     Total Cholesterol: 198 mg/dL

## 2022-10-27 NOTE — Progress Notes (Signed)
Complete physical exam  Patient: Thomas Noble   DOB: 02/05/1961   62 y.o. Male  MRN: [id number]  Subjective:    Chief Complaint  Patient presents with   Annual Exam    Hx of evelated glucose last A1c was 5.8 08/04/22    Thomas Noble is a 62 y.o. male who presents today for a complete physical exam. He reports consuming a general diet. The patient has a physically strenuous job, but has no regular exercise apart from work.  He generally feels well. He reports sleeping well. He does not have additional problems to discuss today.    Most recent fall risk assessment:    10/24/2022    1:25 PM  Fall Risk   Falls in the past year? 0  Number falls in past yr: 0  Injury with Fall? 0     Most recent depression screenings:    10/24/2022    1:21 PM 11/08/2021    8:43 AM  PHQ 2/9 Scores  PHQ - 2 Score 0 0  PHQ- 9 Score  1    Vision:Within last year, Dental: No current dental problems and Receives regular dental care, and PSA: Agrees to PSA testing  Patient Active Problem List   Diagnosis Date Noted   Impaired fasting glucose 10/24/2022   Acute cough 05/26/2022   Congestion of nasal sinus 05/26/2022   Serum calcium elevated 11/11/2021   Blood albumin increased compared with prior measurement 11/11/2021   Onychomycosis 10/08/2020   Sebaceous cyst 10/08/2020   Rash 10/08/2020   Pure hypercholesterolemia 06/20/2016   Elevated fasting glucose 08/26/2015   Lumbar spondylosis 06/14/2014   Gout 11/22/2008   DEPRESSION 11/22/2008   ESSENTIAL HYPERTENSION, BENIGN 11/22/2008   OSTEOPENIA 11/22/2008   ALCOHOL ABUSE, HX OF 11/22/2008   DISC DISEASE, CERVICAL 11/21/2008   Past Medical History:  Diagnosis Date   Allergy    Arthritis    Depression    in the 90's   Essential hypertension, benign 11/22/2008   Qualifier: Diagnosis of  By: [name]     Gout 11/22/2008   Qualifier: Diagnosis of  By: Cathey Endow DO, Karen     HLD (hyperlipidemia) 11/22/2008   Qualifier: Diagnosis of   By: Cathey Endow DO, Karen     Seizures (HCC)    in the late 80's /early 90's   No past surgical history on file. Family History  Problem Relation Age of Onset   Cancer Mother    Alcohol abuse Father    No Known Allergies    Patient Care Team: Nolene Ebbs as PCP - General (Family Medicine)   Outpatient Medications Prior to Visit  Medication Sig   allopurinol (ZYLOPRIM) 300 MG tablet TAKE 1 TABLET BY MOUTH DAILY   cyclobenzaprine (FLEXERIL) 10 MG tablet Take 1 tablet (10 mg total) by mouth 3 (three) times daily as needed for muscle spasms.   [DISCONTINUED] amLODipine (NORVASC) 10 MG tablet TAKE ONE TABLET BY MOUTH DAILY   [DISCONTINUED] predniSONE (DELTASONE) 50 MG tablet One tab PO daily for 5 days.   [DISCONTINUED] celecoxib (CELEBREX) 200 MG capsule TAKE ONE TO TWO CAPSULES BY MOUTH EVERY DAY AS NEEDED FOR PAIN   [DISCONTINUED] pravastatin (PRAVACHOL) 80 MG tablet TAKE 1 TABLET (80 MG TOTAL) BY MOUTH DAILY.   No facility-administered medications prior to visit.    Review of Systems  All other systems reviewed and are negative.         Objective:     BP (!) 140/70  Pulse 71   Ht 6\' 3"  (1.905 m)   Wt 251 lb (113.9 kg)   SpO2 99%   BMI 31.37 kg/m  BP Readings from Last 3 Encounters:  10/24/22 (!) 140/70  05/26/22 (!) 140/82  03/13/22 107/73   Wt Readings from Last 3 Encounters:  10/24/22 251 lb (113.9 kg)  05/26/22 220 lb (99.8 kg)  03/13/22 234 lb (106.1 kg)      Physical Exam   BP (!) 140/70   Pulse 71   Ht 6\' 3"  (1.905 m)   Wt 251 lb (113.9 kg)   SpO2 99%   BMI 31.37 kg/m   General Appearance:    Alert, cooperative, no distress, appears stated age  Head:    Normocephalic, without obvious abnormality, atraumatic  Eyes:    PERRL, conjunctiva/corneas clear, EOM's intact, fundi    benign, both eyes       Ears:    Normal TM's and external ear canals, both ears  Nose:   Nares normal, septum midline, mucosa normal, no drainage    or sinus  tenderness  Throat:   Lips, mucosa, and tongue normal; teeth and gums normal  Neck:   Supple, symmetrical, trachea midline, no adenopathy;       thyroid:  No enlargement/tenderness/nodules; no carotid   bruit or JVD  Back:     Symmetric, no curvature, ROM normal, no CVA tenderness  Lungs:     Clear to auscultation bilaterally, respirations unlabored  Chest wall:    No tenderness or deformity  Heart:    Regular rate and rhythm, S1 and S2 normal, no murmur, rub   or gallop  Abdomen:     Soft, non-tender, bowel sounds active all four quadrants,    no masses, no organomegaly        Extremities:   Extremities normal, atraumatic, no cyanosis or edema  Pulses:   2+ and symmetric all extremities  Skin:   Skin color, texture, turgor normal, no rashes or lesions  Lymph nodes:   Cervical, supraclavicular, and axillary nodes normal  Neurologic:   CNII-XII intact. Normal strength, sensation and reflexes      throughout      Assessment & Plan:    Routine Health Maintenance and Physical Exam  Immunization History  Administered Date(s) Administered   DTaP 12/07/2002   Influenza Split 12/02/2015   Influenza Whole 11/29/2008   Influenza,inj,Quad PF,6+ Mos 01/02/2018, 01/02/2018, 11/08/2021   Influenza-Unspecified 01/05/2003, 01/04/2004, 01/06/2005, 12/13/2005, 12/02/2007, 11/01/2008, 01/02/2011, 12/21/2016, 12/02/2018, 12/16/2019, 12/08/2020   Janssen (J&J) SARS-COV-2 Vaccination 05/15/2019   Moderna Covid-19 Vaccine Bivalent Booster 23yrs & up 12/20/2021   Moderna Sars-Covid-2 Vaccination 07/20/2020   PFIZER(Purple Top)SARS-COV-2 Vaccination 12/23/2019, 12/08/2020   Pneumococcal Polysaccharide-23 10/05/2020   Td (Adult),unspecified 12/07/2002   Tdap 03/03/2012, 08/13/2012, 10/24/2022   Zoster Recombinant(Shingrix) 11/16/2019, 10/05/2020    Health Maintenance  Topic Date Due   COVID-19 Vaccine (6 - 2023-24 season) 11/09/2022 (Originally 02/14/2022)   INFLUENZA VACCINE  06/01/2023  (Originally 10/02/2022)   Colonoscopy  10/24/2023 (Originally 04/28/2004)   Hepatitis C Screening  10/24/2023 (Originally 04/28/1977)   HIV Screening  10/24/2023 (Originally 04/28/1974)   DTaP/Tdap/Td (6 - Td or Tdap) 10/23/2032   Zoster Vaccines- Shingrix  Completed   HPV VACCINES  Aged Out    Discussed health benefits of physical activity, and encouraged him to engage in regular exercise appropriate for his age and condition.  Marland KitchenJomarie Longs "[name]" was seen today for annual exam.  Diagnoses and all orders for this visit:  Routine physical examination -     Cancel: HgB A1c; Future -     Cancel: PSA; Future -     Cancel: Lipid Profile; Future -     Cancel: CMP14+EGFR; Future -     Cancel: CBC w/Diff/Platelet; Future -     Lipid panel -     CMP14+EGFR -     PSA -     Hemoglobin A1c -     CBC w/Diff/Platelet  Essential hypertension, benign -     Cancel: CMP14+EGFR; Future -     Cancel: CBC w/Diff/Platelet; Future -     amLODipine (NORVASC) 10 MG tablet; TAKE ONE TABLET BY MOUTH DAILY -     CMP14+EGFR  Pure hypercholesterolemia -     pravastatin (PRAVACHOL) 80 MG tablet; TAKE 1 TABLET (80 MG TOTAL) BY MOUTH DAILY. -     Cancel: Lipid Profile; Future -     Cancel: CMP14+EGFR; Future -     Lipid panel  Lumbar spondylosis -     celecoxib (CELEBREX) 200 MG capsule; TAKE ONE TO TWO CAPSULES BY MOUTH EVERY DAY AS NEEDED FOR PAIN  Screening for prostate cancer -     Cancel: PSA; Future -     PSA  Chronic gout without tophus, unspecified cause, unspecified site -     Uric acid  Impaired fasting glucose -     Hemoglobin A1c  Need for Tdap vaccination -     Tdap vaccine greater than or equal to 7yo IM   .[name] Discussed 150 minutes of exercise a week.  Encouraged vitamin D 1000 units and Calcium 1300mg  or 4 servings of dairy a day.  PHQ no concerns Fasting labs ordered today Medications refilled Pt declined colonoscopy and cologuard and aware of risk of not screening PSA ordered  today Tdap given today Declined flu and covid vaccine  Return in about 1 year (around 10/24/2023).     Tandy Gaw, PA-C

## 2022-11-07 ENCOUNTER — Encounter: Payer: Self-pay | Admitting: Sports Medicine

## 2022-11-07 ENCOUNTER — Ambulatory Visit: Payer: No Typology Code available for payment source | Admitting: Sports Medicine

## 2022-11-07 DIAGNOSIS — M47816 Spondylosis without myelopathy or radiculopathy, lumbar region: Secondary | ICD-10-CM | POA: Diagnosis not present

## 2022-11-07 MED ORDER — PREDNISONE 50 MG PO TABS
ORAL_TABLET | ORAL | 0 refills | Status: DC
Start: 2022-11-07 — End: 2023-04-06

## 2022-11-07 MED ORDER — CYCLOBENZAPRINE HCL 10 MG PO TABS
10.0000 mg | ORAL_TABLET | Freq: Every day | ORAL | 3 refills | Status: DC
Start: 2022-11-07 — End: 2023-06-16

## 2022-11-07 NOTE — Assessment & Plan Note (Signed)
Thomas Noble returns, we filled out FMLA paperwork today, he has stable lumbar DDD, he gets occasional burst of prednisone maybe a couple of times a year, I refilled his Flexeril, we scanned his FMLA paperwork and he can return to see me as needed. I also gave him some home PT.

## 2022-11-07 NOTE — Progress Notes (Signed)
    Procedures performed today:    None.  Independent interpretation of notes and tests performed by another provider:   None.  Brief History, Exam, Impression, and Recommendations:    Lumbar spondylosis Bralon returns, we filled out FMLA paperwork today, he has stable lumbar DDD, he gets occasional burst of prednisone maybe a couple of times a year, I refilled his Flexeril, we scanned his FMLA paperwork and he can return to see me as needed. I also gave him some home PT.  Chronic process with exacerbation and pharmacologic intervention  ____________________________________________ [name]. Benjamin Stain, M.D., ABFM., CAQSM., AME. Primary Care and Sports Medicine [facility]  Adjunct Professor of Family Medicine  [place] [employer]  Restaurant manager, fast food

## 2022-12-19 ENCOUNTER — Encounter: Payer: Self-pay | Admitting: Physician Assistant

## 2023-04-06 ENCOUNTER — Other Ambulatory Visit: Payer: Self-pay | Admitting: Sports Medicine

## 2023-04-06 DIAGNOSIS — M47816 Spondylosis without myelopathy or radiculopathy, lumbar region: Secondary | ICD-10-CM

## 2023-04-06 MED ORDER — PREDNISONE 50 MG PO TABS
ORAL_TABLET | ORAL | 0 refills | Status: DC
Start: 2023-04-06 — End: 2023-11-27

## 2023-05-14 ENCOUNTER — Other Ambulatory Visit: Payer: Self-pay | Admitting: Physician Assistant

## 2023-05-14 DIAGNOSIS — M1A9XX0 Chronic gout, unspecified, without tophus (tophi): Secondary | ICD-10-CM

## 2023-06-15 ENCOUNTER — Encounter: Payer: Self-pay | Admitting: Sports Medicine

## 2023-06-15 DIAGNOSIS — M47816 Spondylosis without myelopathy or radiculopathy, lumbar region: Secondary | ICD-10-CM

## 2023-06-16 MED ORDER — CYCLOBENZAPRINE HCL 10 MG PO TABS
10.0000 mg | ORAL_TABLET | Freq: Three times a day (TID) | ORAL | 3 refills | Status: DC | PRN
Start: 2023-06-16 — End: 2023-11-27

## 2023-11-03 ENCOUNTER — Encounter: Payer: Self-pay | Admitting: Physician Assistant

## 2023-11-03 ENCOUNTER — Encounter: Payer: Self-pay | Admitting: Sports Medicine

## 2023-11-25 ENCOUNTER — Other Ambulatory Visit: Payer: Self-pay | Admitting: Physician Assistant

## 2023-11-25 DIAGNOSIS — I10 Essential (primary) hypertension: Secondary | ICD-10-CM

## 2023-11-25 DIAGNOSIS — E7800 Pure hypercholesterolemia, unspecified: Secondary | ICD-10-CM

## 2023-11-25 DIAGNOSIS — M47816 Spondylosis without myelopathy or radiculopathy, lumbar region: Secondary | ICD-10-CM

## 2023-11-27 ENCOUNTER — Encounter: Payer: Self-pay | Admitting: Physician Assistant

## 2023-11-27 ENCOUNTER — Ambulatory Visit: Admitting: Physician Assistant

## 2023-11-27 VITALS — BP 114/63 | HR 115 | Ht 75.0 in | Wt 256.0 lb

## 2023-11-27 DIAGNOSIS — Z0000 Encounter for general adult medical examination without abnormal findings: Secondary | ICD-10-CM

## 2023-11-27 DIAGNOSIS — I10 Essential (primary) hypertension: Secondary | ICD-10-CM | POA: Diagnosis not present

## 2023-11-27 DIAGNOSIS — Z23 Encounter for immunization: Secondary | ICD-10-CM | POA: Diagnosis not present

## 2023-11-27 DIAGNOSIS — E7800 Pure hypercholesterolemia, unspecified: Secondary | ICD-10-CM

## 2023-11-27 DIAGNOSIS — D229 Melanocytic nevi, unspecified: Secondary | ICD-10-CM | POA: Insufficient documentation

## 2023-11-27 DIAGNOSIS — Z125 Encounter for screening for malignant neoplasm of prostate: Secondary | ICD-10-CM

## 2023-11-27 DIAGNOSIS — I499 Cardiac arrhythmia, unspecified: Secondary | ICD-10-CM | POA: Diagnosis not present

## 2023-11-27 DIAGNOSIS — E66811 Obesity, class 1: Secondary | ICD-10-CM

## 2023-11-27 DIAGNOSIS — M1A9XX0 Chronic gout, unspecified, without tophus (tophi): Secondary | ICD-10-CM

## 2023-11-27 DIAGNOSIS — M47816 Spondylosis without myelopathy or radiculopathy, lumbar region: Secondary | ICD-10-CM | POA: Diagnosis not present

## 2023-11-27 DIAGNOSIS — E6609 Other obesity due to excess calories: Secondary | ICD-10-CM

## 2023-11-27 DIAGNOSIS — I4891 Unspecified atrial fibrillation: Secondary | ICD-10-CM

## 2023-11-27 DIAGNOSIS — Z6832 Body mass index (BMI) 32.0-32.9, adult: Secondary | ICD-10-CM

## 2023-11-27 MED ORDER — PRAVASTATIN SODIUM 80 MG PO TABS: ORAL_TABLET | ORAL | 3 refills | Status: AC

## 2023-11-27 MED ORDER — CELECOXIB 200 MG PO CAPS: ORAL_CAPSULE | ORAL | 3 refills | Status: DC

## 2023-11-27 MED ORDER — CYCLOBENZAPRINE HCL 10 MG PO TABS: 10.0000 mg | ORAL_TABLET | Freq: Three times a day (TID) | ORAL | 3 refills | Status: AC | PRN

## 2023-11-27 MED ORDER — ALLOPURINOL 300 MG PO TABS: 300.0000 mg | ORAL_TABLET | Freq: Every day | ORAL | 2 refills | Status: AC

## 2023-11-27 MED ORDER — PREDNISONE 50 MG PO TABS: ORAL_TABLET | ORAL | 0 refills | Status: DC

## 2023-11-27 MED ORDER — AMLODIPINE BESYLATE 10 MG PO TABS: ORAL_TABLET | ORAL | 3 refills | Status: DC

## 2023-11-27 MED ORDER — METOPROLOL SUCCINATE ER 25 MG PO TB24: 25.0000 mg | ORAL_TABLET | Freq: Every day | ORAL | 0 refills | Status: DC

## 2023-11-27 MED ORDER — APIXABAN 5 MG PO TABS: 5.0000 mg | ORAL_TABLET | Freq: Two times a day (BID) | ORAL | 0 refills | Status: DC

## 2023-11-27 NOTE — Patient Instructions (Addendum)
 Please consider going to dermatology or letting me biopsy the mole on left side of scalp.  Stop celebrex .  Start eliquis  and metoprolol .  1 week recheck here if cannot get in with cardiologist.   Atrial Fibrillation Atrial fibrillation (AFib) is a type of heartbeat that is irregular or fast. If you have AFib, your heart beats without any order. This makes it hard for your heart to pump blood in a normal way. AFib may come and go, or it may become a long-lasting problem. If AFib is not treated, it can put you at higher risk for stroke, heart failure, and other heart problems. What are the causes? AFib may be caused by diseases that damage the heart's electrical system. They include: High blood pressure. Heart failure. Heart valve diseases. Heart surgery. Diabetes. Thyroid disease. Kidney disease. Lung diseases, such as pneumonia or COPD. Sleep apnea. Sometimes the cause is not known. What increases the risk? You are more likely to develop AFib if: You are older. You exercise often and very hard. You have a family history of AFib. You are male. You are Caucasian. You are overweight. You smoke. You drink a lot of alcohol. What are the signs or symptoms? Common symptoms of this condition include: A feeling that your heart is beating very fast. Chest pain or discomfort. Feeling short of breath. Suddenly feeling light-headed or weak. Getting tired easily during activity. Fainting. Sweating. In some cases, there are no symptoms. How is this treated? Medicines to: Prevent blood clots. Treat heart rate or heart rhythm problems. Using devices, such as a pacemaker, to correct heart rhythm problems. Doing surgery to remove the part of the heart that sends bad signals. Closing an area where clots can form in the heart (left atrial appendage). In some cases, your doctor will treat other underlying conditions. Follow these instructions at home: Medicines Take over-the-counter and  prescription medicines only as told by your doctor. Do not take any new medicines without first talking to your doctor. If you are taking blood thinners: Talk with your doctor before taking aspirin or NSAIDs, such as ibuprofen . Take your medicines as told. Take them at the same time each day. Do not do things that could hurt or bruise you. Be careful to avoid falls. Wear an alert bracelet or carry a card that says you take blood thinners. Lifestyle Do not smoke or use any products that contain nicotine or tobacco. If you need help quitting, ask your doctor. Eat heart-healthy foods. Talk with your doctor about the right eating plan for you. Exercise regularly as told by your doctor. Do not drink alcohol. Lose weight if you are overweight. General instructions If you have sleep apnea, treat it as told by your doctor. Do not use diet pills unless your doctor says they are safe for you. Diet pills may make heart problems worse. Keep all follow-up visits. Your doctor will check your heart rate and rhythm regularly. Contact a doctor if: You notice a change in the speed, rhythm, or strength of your heartbeat. You are taking a blood-thinning medicine and you get more bruising. You get tired more easily when you move or exercise. You have a sudden change in weight. Get help right away if:  You have pain in your chest. You have trouble breathing. You have side effects of blood thinners, such as blood in your vomit, poop (stool), or pee (urine), or bleeding that cannot stop. You have any signs of a stroke. BE FAST is an easy way  to remember the main warning signs: B - Balance. Dizziness, sudden trouble walking, or loss of balance. E - Eyes. Trouble seeing or a change in how you see. F - Face. Sudden weakness or loss of feeling in the face. The face or eyelid may droop on one side. A - Arms.Weakness or loss of feeling in an arm. This happens suddenly and usually on one side of the body. S -  Speech. Sudden trouble speaking, slurred speech, or trouble understanding what people say. T - Time.Time to call emergency services. Write down what time symptoms started. You have other signs of a stroke, such as: A sudden, very bad headache with no known cause. Feeling like you may vomit (nausea). Vomiting. A seizure. These symptoms may be an emergency. Get help right away. Call 911. Do not wait to see if the symptoms will go away. Do not drive yourself to the hospital. This information is not intended to replace advice given to you by your health care provider. Make sure you discuss any questions you have with your health care provider. Document Revised: 11/06/2021 Document Reviewed: 11/06/2021 Elsevier Patient Education  2024 [employer]  Health Maintenance, Male Adopting a healthy lifestyle and getting preventive care are important in promoting health and wellness. Ask your health care provider about: The right schedule for you to have regular tests and exams. Things you can do on your own to prevent diseases and keep yourself healthy. What should I know about diet, weight, and exercise? Eat a healthy diet  Eat a diet that includes plenty of vegetables, fruits, low-fat dairy products, and lean protein. Do not eat a lot of foods that are high in solid fats, added sugars, or sodium. Maintain a healthy weight Body mass index (BMI) is a measurement that can be used to identify possible weight problems. It estimates body fat based on height and weight. Your health care provider can help determine your BMI and help you achieve or maintain a healthy weight. Get regular exercise Get regular exercise. This is one of the most important things you can do for your health. Most adults should: Exercise for at least 150 minutes each week. The exercise should increase your heart rate and make you sweat (moderate-intensity exercise). Do strengthening exercises at least twice a week. This is in  addition to the moderate-intensity exercise. Spend less time sitting. Even light physical activity can be beneficial. Watch cholesterol and blood lipids Have your blood tested for lipids and cholesterol at 64 years of age, then have this test every 5 years. You may need to have your cholesterol levels checked more often if: Your lipid or cholesterol levels are high. You are older than 64 years of age. You are at high risk for heart disease. What should I know about cancer screening? Many types of cancers can be detected early and may often be prevented. Depending on your health history and family history, you may need to have cancer screening at various ages. This may include screening for: Colorectal cancer. Prostate cancer. Skin cancer. Lung cancer. What should I know about heart disease, diabetes, and high blood pressure? Blood pressure and heart disease High blood pressure causes heart disease and increases the risk of stroke. This is more likely to develop in people who have high blood pressure readings or are overweight. Talk with your health care provider about your target blood pressure readings. Have your blood pressure checked: Every 3-5 years if you are 73-53 years of age. Every year if  you are 20 years old or older. If you are between the ages of 67 and 24 and are a current or former smoker, ask your health care provider if you should have a one-time screening for abdominal aortic aneurysm (AAA). Diabetes Have regular diabetes screenings. This checks your fasting blood sugar level. Have the screening done: Once every three years after age 92 if you are at a normal weight and have a low risk for diabetes. More often and at a younger age if you are overweight or have a high risk for diabetes. What should I know about preventing infection? Hepatitis B If you have a higher risk for hepatitis B, you should be screened for this virus. Talk with your health care provider to find out  if you are at risk for hepatitis B infection. Hepatitis C Blood testing is recommended for: Everyone born from [birth year] through 1966. Anyone with known risk factors for hepatitis C. Sexually transmitted infections (STIs) You should be screened each year for STIs, including gonorrhea and chlamydia, if: You are sexually active and are younger than 64 years of age. You are older than 64 years of age and your health care provider tells you that you are at risk for this type of infection. Your sexual activity has changed since you were last screened, and you are at increased risk for chlamydia or gonorrhea. Ask your health care provider if you are at risk. Ask your health care provider about whether you are at high risk for HIV. Your health care provider may recommend a prescription medicine to help prevent HIV infection. If you choose to take medicine to prevent HIV, you should first get tested for HIV. You should then be tested every 3 months for as long as you are taking the medicine. Follow these instructions at home: Alcohol use Do not drink alcohol if your health care provider tells you not to drink. If you drink alcohol: Limit how much you have to 0-2 drinks a day. Know how much alcohol is in your drink. In the [place] one drink equals one 12 oz bottle of beer (355 mL), one 5 oz glass of wine (148 mL), or one 1 oz glass of hard liquor (44 mL). Lifestyle Do not use any products that contain nicotine or tobacco. These products include cigarettes, chewing tobacco, and vaping devices, such as e-cigarettes. If you need help quitting, ask your health care provider. Do not use street drugs. Do not share needles. Ask your health care provider for help if you need support or information about quitting drugs. General instructions Schedule regular health, dental, and eye exams. Stay current with your vaccines. Tell your health care provider if: You often feel depressed. You have ever been abused or do  not feel safe at home. Summary Adopting a healthy lifestyle and getting preventive care are important in promoting health and wellness. Follow your health care provider's instructions about healthy diet, exercising, and getting tested or screened for diseases. Follow your health care provider's instructions on monitoring your cholesterol and blood pressure. This information is not intended to replace advice given to you by your health care provider. Make sure you discuss any questions you have with your health care provider. Document Revised: 07/09/2020 Document Reviewed: 07/09/2020 Elsevier Patient Education  2024 [employer].

## 2023-11-27 NOTE — Progress Notes (Signed)
 Complete physical exam  Patient: Thomas Noble   DOB: 07/27/1960   63 y.o. Male  MRN: [id number]  Subjective:    Chief Complaint  Patient presents with   Annual Exam    Thomas Noble is a 63 y.o. male who presents today for a complete physical exam. He reports consuming an unbalanced diet high in lunch meat, canned foods, boxed foods. He eats vegetables sometimes, but not daily. He states he gets exercise in at work. He walks about 3-5 miles a day at work. He generally feels well. He reports sleeping well. His wife states he snores nightly and appears to have apneic periods. He does have additional problems to discuss today.  States he has been more stressed recently due to life changes. Feels his heart race increase but denies vision changes, headaches, CP, palpitations.      Most recent fall risk assessment:    11/27/2023    4:08 PM  Fall Risk   Falls in the past year? 0  Number falls in past yr: 0  Injury with Fall? 0  Risk for fall due to : No Fall Risks  Follow up Falls evaluation completed     Most recent depression screenings:    11/27/2023   11:51 AM 10/24/2022    1:21 PM  PHQ 2/9 Scores  PHQ - 2 Score 2 0  PHQ- 9 Score 6     Vision:Not within last year , Dental: No current dental problems and No regular dental care , and PSA: Agrees to PSA testing   Patient Care Team: Evianna Chandran L, PA-C as PCP - General (Family Medicine)   Outpatient Medications Prior to Visit  Medication Sig   Cholecalciferol (D3) 25 MCG (1000 UT) capsule Take 1,000 Units by mouth daily.   Cholecalciferol (VITAMIN D-3) 25 MCG (1000 UT) CAPS Take 1,000 capsules by mouth daily.   vitamin B-12 (CYANOCOBALAMIN) 500 MCG tablet Take 500 mcg by mouth daily.   Zinc 50 MG TABS Take 50 mg by mouth daily.   [DISCONTINUED] allopurinol  (ZYLOPRIM ) 300 MG tablet TAKE 1 TABLET BY MOUTH DAILY   [DISCONTINUED] amLODipine  (NORVASC ) 10 MG tablet TAKE ONE TABLET BY MOUTH DAILY   [DISCONTINUED] celecoxib   (CELEBREX ) 200 MG capsule TAKE ONE TO TWO CAPSULES BY MOUTH EVERY DAY AS NEEDED FOR PAIN   [DISCONTINUED] cyclobenzaprine  (FLEXERIL ) 10 MG tablet Take 1 tablet (10 mg total) by mouth 3 (three) times daily as needed for muscle spasms.   [DISCONTINUED] pravastatin  (PRAVACHOL ) 80 MG tablet TAKE 1 TABLET (80 MG TOTAL) BY MOUTH DAILY.   [DISCONTINUED] predniSONE  (DELTASONE ) 50 MG tablet One tab PO daily for 5 days.   No facility-administered medications prior to visit.    Review of Systems  All other systems reviewed and are negative.         Objective:     BP 114/63 (BP Location: Right Arm, Patient Position: Sitting, Cuff Size: Large)   Pulse (!) 115   Ht 6' 3 (1.905 m)   Wt 256 lb (116.1 kg)   SpO2 100%   BMI 32.00 kg/m  BP Readings from Last 3 Encounters:  11/27/23 114/63  11/07/22 (!) 166/82  10/24/22 (!) 140/70      Physical Exam Constitutional:      General: He is not in acute distress.    Appearance: Normal appearance.  HENT:     Head: Normocephalic and atraumatic.     Right Ear: Tympanic membrane and ear canal normal. There is no impacted cerumen.  Left Ear: Tympanic membrane and ear canal normal. There is no impacted cerumen.     Nose: Nose normal.     Mouth/Throat:     Mouth: Mucous membranes are moist.     Pharynx: Oropharynx is clear.  Eyes:     Pupils: Pupils are equal, round, and reactive to light.  Cardiovascular:     Rate and Rhythm: Tachycardia present. Rhythm irregularly irregular.     Pulses:          Radial pulses are 2+ on the right side and 2+ on the left side.  Pulmonary:     Effort: Pulmonary effort is normal.     Breath sounds: Normal breath sounds and air entry.  Musculoskeletal:     Right lower leg: No edema.     Left lower leg: No edema.  Skin:    General: Skin is warm and dry.     Findings: Lesion present.         Comments: Suspicious nevis found on left front hair line about 1.5cm long  Neurological:     Mental Status: He is  alert.          Assessment & Plan:    Routine Health Maintenance and Physical Exam  Immunization History  Administered Date(s) Administered   DTaP 12/07/2002   Influenza Split 12/02/2015   Influenza Whole 11/29/2008   Influenza, Seasonal, Injecte, Preservative Fre 11/27/2023   Influenza,inj,Quad PF,6+ Mos 01/02/2018, 01/02/2018, 11/08/2021   Influenza-Unspecified 01/05/2003, 01/04/2004, 01/06/2005, 12/13/2005, 12/02/2007, 11/01/2008, 01/02/2011, 12/21/2016, 12/02/2018, 12/16/2019, 12/08/2020, 12/19/2022   Janssen (J&J) SARS-COV-2 Vaccination 05/15/2019   Moderna Covid-19 Vaccine Bivalent Booster 61yrs & up 12/20/2021   Moderna Sars-Covid-2 Vaccination 07/20/2020   PFIZER(Purple Top)SARS-COV-2 Vaccination 12/23/2019, 12/08/2020   PNEUMOCOCCAL CONJUGATE-20 11/27/2023   Pneumococcal Polysaccharide-23 10/05/2020   Td (Adult),unspecified 12/07/2002   Tdap 03/03/2012, 08/13/2012, 10/24/2022   Unspecified SARS-COV-2 Vaccination 12/19/2022   Zoster Recombinant(Shingrix) 11/16/2019, 10/05/2020    Health Maintenance  Topic Date Due   Colonoscopy  11/26/2024 (Originally 04/28/2004)   Hepatitis C Screening  11/26/2024 (Originally 04/28/1977)   HIV Screening  11/26/2024 (Originally 04/28/1974)   DTaP/Tdap/Td (6 - Td or Tdap) 10/23/2032   Pneumococcal Vaccine: 50+ Years  Completed   Influenza Vaccine  Completed   COVID-19 Vaccine  Completed   Zoster Vaccines- Shingrix  Completed   Hepatitis B Vaccines 19-59 Average Risk  Aged Out   HPV VACCINES  Aged Out   Meningococcal B Vaccine  Aged Out    Discussed health benefits of physical activity, and encouraged him to engage in regular exercise appropriate for his age and condition.  SABRARoda Noble was seen today for annual exam.  Diagnoses and all orders for this visit:  Routine physical examination -     CBC with Differential/Platelet -     CMP14+EGFR -     Lipid panel -     TSH -     VITAMIN D 25 Hydroxy (Vit-D Deficiency,  Fractures) -     B12 and Folate Panel -     Uric acid  Lumbar spondylosis -     cyclobenzaprine  (FLEXERIL ) 10 MG tablet; Take 1 tablet (10 mg total) by mouth 3 (three) times daily as needed for muscle spasms. -     celecoxib  (CELEBREX ) 200 MG capsule; TAKE ONE TO TWO CAPSULES BY MOUTH EVERY DAY AS NEEDED FOR PAIN -     predniSONE  (DELTASONE ) 50 MG tablet; One tab PO daily for 5 days.  Chronic gout without tophus, unspecified  cause, unspecified site -     allopurinol  (ZYLOPRIM ) 300 MG tablet; Take 1 tablet (300 mg total) by mouth daily. -     Uric acid  Essential hypertension, benign -     amLODipine  (NORVASC ) 10 MG tablet; TAKE ONE TABLET BY MOUTH DAILY  Pure hypercholesterolemia -     pravastatin  (PRAVACHOL ) 80 MG tablet; TAKE 1 TABLET (80 MG TOTAL) BY MOUTH DAILY.  Suspicious nevus  Prostate cancer screening -     PSA  Irregular heartbeat -     EKG 12-Lead  Immunization due -     Flu vaccine trivalent PF, 6mos and older(Flulaval,Afluria,Fluarix,Fluzone) -     Pneumococcal conjugate vaccine 20-valent (Prevnar 20)  Atrial fibrillation with RVR (HCC) -     apixaban  (ELIQUIS ) 5 MG TABS tablet; Take 1 tablet (5 mg total) by mouth 2 (two) times daily. -     metoprolol  succinate (TOPROL -XL) 25 MG 24 hr tablet; Take 1 tablet (25 mg total) by mouth daily. -     Ambulatory referral to Cardiology  Class 1 obesity due to excess calories with serious comorbidity and body mass index (BMI) of 32.0 to 32.9 in adult   Pt aware of the risks of OSA. Pt is high risk. Declined sleep study at this moment.   Suspicious nevis discovered on left side of scalp. Recommended pt see derm. [place] he would consider seeing dermatology or letting me remove it and biopsy as soon as possible.   Irregular rhythm auscultated, EKG ordered today. Shows A-fib RVR. Pt is asymptomatic in office and unwilling to go to the hospital; was educated on symptoms that warrant ER visit such as chest pain, SOB, palpitations,  sweating, dizziness, syncope. Urgent cardiology referral. Follow up in 1 week if cardiology has not reached out. Start Eliquis  and stop Celebrex . Start Metoprolol  succinate. CHADsVACs score was 1 today.   Discussed balanced diet Fasting labs ordered today Medications refilled  Pt declined colonoscopy and cologaurd and aware of risk of not screening  Flu and Prevnar 20 given today  Pt declined COVID vaccine  Return in about 1 week (around 12/04/2023).     Neeko Pharo, PA-C

## 2023-11-28 LAB — CMP14+EGFR
ALT: 19 IU/L (ref 0–44)
AST: 18 IU/L (ref 0–40)
Albumin: 4.7 g/dL (ref 3.9–4.9)
Alkaline Phosphatase: 108 IU/L (ref 47–123)
BUN/Creatinine Ratio: 10 (ref 10–24)
BUN: 9 mg/dL (ref 8–27)
Bilirubin Total: 0.5 mg/dL (ref 0.0–1.2)
CO2: 20 mmol/L (ref 20–29)
Calcium: 9.8 mg/dL (ref 8.6–10.2)
Chloride: 102 mmol/L (ref 96–106)
Creatinine, Ser: 0.86 mg/dL (ref 0.76–1.27)
Globulin, Total: 2.1 g/dL (ref 1.5–4.5)
Glucose: 105 mg/dL — ABNORMAL HIGH (ref 70–99)
Potassium: 4.6 mmol/L (ref 3.5–5.2)
Sodium: 139 mmol/L (ref 134–144)
Total Protein: 6.8 g/dL (ref 6.0–8.5)
eGFR: 97 mL/min/1.73 (ref >59)

## 2023-11-28 LAB — CBC WITH DIFFERENTIAL/PLATELET
Basophils Absolute: 0.1 x10E3/uL (ref 0.0–0.2)
Basos: 1 %
EOS (ABSOLUTE): 0.3 x10E3/uL (ref 0.0–0.4)
Eos: 5 %
Hematocrit: 47.4 % (ref 37.5–51.0)
Hemoglobin: 15.7 g/dL (ref 13.0–17.7)
Immature Grans (Abs): 0 x10E3/uL (ref 0.0–0.1)
Immature Granulocytes: 0 %
Lymphocytes Absolute: 1.7 x10E3/uL (ref 0.7–3.1)
Lymphs: 26 %
MCH: 32 pg (ref 26.6–33.0)
MCHC: 33.1 g/dL (ref 31.5–35.7)
MCV: 97 fL (ref 79–97)
Monocytes Absolute: 0.6 x10E3/uL (ref 0.1–0.9)
Monocytes: 10 %
Neutrophils Absolute: 3.9 x10E3/uL (ref 1.4–7.0)
Neutrophils: 58 %
Platelets: 246 x10E3/uL (ref 150–450)
RBC: 4.91 x10E6/uL (ref 4.14–5.80)
RDW: 12.7 % (ref 11.6–15.4)
WBC: 6.7 x10E3/uL (ref 3.4–10.8)

## 2023-11-28 LAB — PSA: Prostate Specific Ag, Serum: 2.6 ng/mL (ref 0.0–4.0)

## 2023-11-28 LAB — URIC ACID: Uric Acid: 5.3 mg/dL (ref 3.8–8.4)

## 2023-11-28 LAB — TSH: TSH: 1.21 u[IU]/mL (ref 0.450–4.500)

## 2023-11-28 LAB — B12 AND FOLATE PANEL
Folate: 19.6 ng/mL (ref >3.0)
Vitamin B-12: 570 pg/mL (ref 232–1245)

## 2023-11-28 LAB — LIPID PANEL
Chol/HDL Ratio: 3.6 ratio (ref 0.0–5.0)
Cholesterol, Total: 167 mg/dL (ref 100–199)
HDL: 46 mg/dL (ref >39)
LDL Chol Calc (NIH): 107 mg/dL — ABNORMAL HIGH (ref 0–99)
Triglycerides: 74 mg/dL (ref 0–149)
VLDL Cholesterol Cal: 14 mg/dL (ref 5–40)

## 2023-11-28 LAB — VITAMIN D 25 HYDROXY (VIT D DEFICIENCY, FRACTURES): Vit D, 25-Hydroxy: 28.4 ng/mL — ABNORMAL LOW (ref 30.0–100.0)

## 2023-11-29 ENCOUNTER — Ambulatory Visit: Payer: Self-pay | Admitting: Physician Assistant

## 2023-11-29 NOTE — Progress Notes (Signed)
 [name],   Triglycerides have improved.  LDL improved but not to goal. Thoughts on trying crestor a more powerful statin. You are currently on pravachol .  Kidney and liver look good.  Prostate enzymes stable.  Thyroid normal.  B12 and folate look good.  Vitamin D is low. Make sure taking D3 2000 units daily.  Uric acid normal.

## 2023-12-01 ENCOUNTER — Ambulatory Visit: Admitting: Cardiology

## 2023-12-04 ENCOUNTER — Encounter: Payer: Self-pay | Admitting: Physician Assistant

## 2023-12-04 ENCOUNTER — Ambulatory Visit (INDEPENDENT_AMBULATORY_CARE_PROVIDER_SITE_OTHER): Admitting: Physician Assistant

## 2023-12-04 VITALS — BP 131/81 | HR 116 | Ht 75.0 in | Wt 253.0 lb

## 2023-12-04 DIAGNOSIS — I10 Essential (primary) hypertension: Secondary | ICD-10-CM | POA: Diagnosis not present

## 2023-12-04 DIAGNOSIS — I4891 Unspecified atrial fibrillation: Secondary | ICD-10-CM | POA: Diagnosis not present

## 2023-12-04 DIAGNOSIS — E7800 Pure hypercholesterolemia, unspecified: Secondary | ICD-10-CM | POA: Diagnosis not present

## 2023-12-04 MED ORDER — DILTIAZEM HCL ER COATED BEADS 120 MG PO CP24: 120.0000 mg | ORAL_CAPSULE | Freq: Every day | ORAL | 0 refills | Status: DC

## 2023-12-04 NOTE — Progress Notes (Addendum)
   Established Patient Office Visit  Subjective   Patient ID: Thomas Noble, male    DOB: 10-03-1960  Age: 63 y.o. MRN: [id number]  Chief Complaint  Patient presents with   Medical Management of Chronic Issues    HPI Discussed the use of AI scribe software for clinical note transcription with the patient, who gave verbal consent to proceed.  History of Present Illness Thomas Noble is a 63 year old male with atrial fibrillation who presents for medication management and follow-up.  Atrial fibrillation - Diagnosed with atrial fibrillation - Awaiting cardiology appointment scheduled for October 20th - Started on a beta blocker and a blood thinner - No shortness of breath - No worsening of condition  Back pain - Increased back pain after discontinuing Celebrex  - Pain is manageable  Gout - Currently taking allopurinol  for gout  Medication management - Prescribed diltiazem 120 mg once daily, previously on Norvasc  - hesitant about trying new cholesterol medication crestor, going to stay on pravachol  for now.  - Using Flexeril  for muscle pain, but medication was not available for pickup at the time of visit    ROS See HPI.    Objective:     BP 131/81   Pulse (!) 116   Ht 6' 3 (1.905 m)   Wt 253 lb (114.8 kg)   SpO2 99%   BMI 31.62 kg/m  BP Readings from Last 3 Encounters:  12/04/23 131/81  11/27/23 114/63  11/07/22 (!) 166/82   Wt Readings from Last 3 Encounters:  12/04/23 253 lb (114.8 kg)  11/27/23 256 lb (116.1 kg)  11/07/22 255 lb (115.7 kg)      Physical Exam Constitutional:      Appearance: Normal appearance.  HENT:     Head: Normocephalic.  Cardiovascular:     Rate and Rhythm: Tachycardia present. Rhythm irregular.  Pulmonary:     Effort: Pulmonary effort is normal.     Breath sounds: Normal breath sounds.  Musculoskeletal:     Right lower leg: No edema.     Left lower leg: No edema.  Neurological:     General: No focal deficit present.      Mental Status: He is alert and oriented to person, place, and time.  Psychiatric:        Mood and Affect: Mood normal.      The 10-year ASCVD risk score (Arnett DK, et al., 2019) is: 13.2%    Assessment & Plan:  .Thomas Noble was seen today for medical management of chronic issues.  Diagnoses and all orders for this visit:  Atrial fibrillation with RVR (HCC) -     diltiazem (CARDIZEM CD) 120 MG 24 hr capsule; Take 1 capsule (120 mg total) by mouth daily.  Essential hypertension, benign  Pure hypercholesterolemia   Assessment & Plan Atrial fibrillation Persistent atrial fibrillation without symptoms. Current medication ineffective. Cardiologist appointment scheduled. Discussed adding diltiazem for rate control and potential rhythm conversion. Prefers minimal medication but understands importance of rhythm control. Informed about diltiazem's cost-effectiveness. - Start diltiazem 120 mg once daily. - Discontinue Norvasc . - Continue current beta blocker and anticoagulant. - ok to continue pravachol  for now but not the best statin, pt would like to hold on crestor until he talks with cardiology.  - Attend cardiology appointment on October 20th. - Seek emergency care if symptoms develop.     Return if symptoms worsen or fail to improve.    Candence Sease, PA-C

## 2023-12-04 NOTE — Patient Instructions (Signed)
 Stop norvasc . Start diltiazem once a day.  Continue on the other medications.

## 2023-12-07 ENCOUNTER — Encounter: Payer: Self-pay | Admitting: Physician Assistant

## 2023-12-08 ENCOUNTER — Telehealth: Payer: Self-pay | Admitting: Physician Assistant

## 2023-12-08 MED ORDER — TRAMADOL HCL 50 MG PO TABS: 50.0000 mg | ORAL_TABLET | Freq: Four times a day (QID) | ORAL | 0 refills | Status: AC | PRN

## 2023-12-08 NOTE — Telephone Encounter (Signed)
 Copied from CRM #[id number]. Topic: Clinical - Medication Question >> Dec 08, 2023  9:17 AM Olam RAMAN wrote: Reason for CRM: Pt wife called to let dr know ot send in tremadull to pharmacy

## 2023-12-14 MED ORDER — TRAMADOL HCL 50 MG PO TABS: 50.0000 mg | ORAL_TABLET | Freq: Four times a day (QID) | ORAL | 0 refills | Status: DC | PRN

## 2023-12-14 NOTE — Addendum Note (Signed)
 Addended by: ANTONIETTE VERMELL CROME on: 12/14/2023 12:59 PM   Modules accepted: Orders

## 2023-12-14 NOTE — Telephone Encounter (Signed)
 Copied from CRM #[id number]. Topic: Clinical - Medication Question >> Dec 08, 2023  9:17 AM Olam RAMAN wrote: Reason for CRM: Pt wife called to let dr know ot send in tremadull to pharmacy >> Dec 14, 2023  9:18 AM Antwanette L wrote: Graylin, the patient's wife, called to request a refill of Tramadol  HCl 50 mg tablets. She would like to know if Jade Breeback can authorize a 60- or 90-day supply. If that is not possible, she requests that a 30-day supply be prescribed instead. Graylin is requesting a follow up via MyChart

## 2023-12-15 ENCOUNTER — Encounter: Payer: Self-pay | Admitting: Physician Assistant

## 2023-12-20 NOTE — Progress Notes (Addendum)
 " Electrophysiology Office Note:   Date:  12/21/2023  ID:  Thomas Noble, DOB 11-10-1960, MRN [id number]  Primary Cardiologist: None Primary Heart Failure: None Electrophysiologist: None      History of Present Illness:   Thomas Noble is a 63 y.o. male with h/o atrial fibrillation seen today for  for Electrophysiology evaluation of atrial fibrillation at the request of Vermell Bologna.    Discussed the use of AI scribe software for clinical note transcription with the patient, who gave verbal consent to proceed.  History of Present Illness Thomas Noble is a 63 year old male with atrial fibrillation who presents for evaluation and management of his condition. He was referred by his primary provider for evaluation of possible atrial fibrillation after an EKG was performed.  He has been diagnosed with atrial fibrillation following an EKG performed by his primary provider. No symptoms such as fatigue, shortness of breath, or palpitations are present that would have indicated the presence of atrial fibrillation. 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  He is currently on Eliquis  and metoprolol . His primary provider recently changed his medication.  He has a history of high blood pressure for which he is on medication. He is not diabetic, has never had a stroke, and does not have any known vascular disease. He is approaching 63 years of age, which contributes to his risk factors for stroke.  He expresses concerns about the cost of medical procedures and is hesitant to incur medical debt.     Review of systems complete and found to be negative unless listed in HPI.   EP Information / Studies Reviewed:    EKG is ordered today. Personal review as below.  EKG Interpretation Date/Time:  Monday December 21 2023 14:58:40 EDT Ventricular Rate:  109 PR Interval:    QRS Duration:  102 QT Interval:  340 QTC Calculation: 457 R Axis:   -56  Text Interpretation: Atrial  fibrillation with rapid ventricular response Left axis deviation Cannot rule out Anterior infarct , age undetermined No previous ECGs available Confirmed by Molleigh Huot ([id number]) on 12/21/2023 3:08:13 PM   Risk Assessment/Calculations:    CHA2DS2-VASc Score = 1   This indicates a 0.6% annual risk of stroke. The patient's score is based upon: CHF History: 0 HTN History: 1 Diabetes History: 0 Stroke History: 0 Vascular Disease History: 0 Age Score: 0 Gender Score: 0              Physical Exam:   VS:  BP 134/76   Pulse (!) 109   Ht 6' 3 (1.905 m)   Wt 259 lb (117.5 kg)   SpO2 98%   BMI 32.37 kg/m    Wt Readings from Last 3 Encounters:  12/21/23 259 lb (117.5 kg)  12/04/23 253 lb (114.8 kg)  11/27/23 256 lb (116.1 kg)     GEN: Well nourished, well developed in no acute distress NECK: No JVD; No carotid bruits CARDIAC: Irregularly irregular rate and rhythm, no murmurs, rubs, gallops RESPIRATORY:  Clear to auscultation without rales, wheezing or rhonchi  ABDOMEN: Soft, non-tender, non-distended EXTREMITIES:  No edema; No deformity   ASSESSMENT AND PLAN:    1.  Persistent atrial fibrillation: On Eliquis , diltiazem  and metoprolol .  He has had labs including a TSH without abnormality.  He is unaware of his atrial fibrillation.  He is quite concerned about medical costs.  He is unsure if he wants to proceed with echocardiogram.  I have told him that  it would likely be beneficial to rule out structural abnormalities.  For now, he would like to know the cost of medical procedures including his echo.  If he is able to afford this, we Thomas Noble plan for transthoracic echo.  Thomas Noble increase his Toprol -XL to 100 mg for improved rate control.  2.  Hypertension: Well-controlled  Case discussed with referring provider  Follow up with EP Team in 3 months  Signed, Thomas Hurrell Gladis Norton, MD  "

## 2023-12-21 ENCOUNTER — Encounter: Payer: Self-pay | Admitting: Cardiology

## 2023-12-21 ENCOUNTER — Encounter: Payer: Self-pay | Admitting: Physician Assistant

## 2023-12-21 ENCOUNTER — Ambulatory Visit: Attending: Cardiology | Admitting: Cardiology

## 2023-12-21 VITALS — BP 134/76 | HR 109 | Ht 75.0 in | Wt 259.0 lb

## 2023-12-21 DIAGNOSIS — I4891 Unspecified atrial fibrillation: Secondary | ICD-10-CM | POA: Diagnosis not present

## 2023-12-21 DIAGNOSIS — I10 Essential (primary) hypertension: Secondary | ICD-10-CM

## 2023-12-21 NOTE — Patient Instructions (Addendum)
 Medication Instructions:  Your physician recommends that you continue on your current medications as directed. Please refer to the Current Medication list given to you today.  *If you need a refill on your cardiac medications before your next appointment, please call your pharmacy*  Lab Work: None ordered  If you have any lab test that is abnormal or we need to change your treatment, we will call you to review the results.  Testing/Procedures: Your physician has requested that you have an echocardiogram. Echocardiography is a painless test that uses sound waves to create images of your heart. It provides your doctor with information about the size and shape of your heart and how well your heart's chambers and valves are working. This procedure takes approximately one hour. There are no restrictions for this procedure. Please do NOT wear cologne, perfume, aftershave, or lotions (deodorant is allowed). Please arrive 15 minutes prior to your appointment time.  Please note: We ask at that you not bring children with you during ultrasound (echo/ vascular) testing. Due to room size and safety concerns, children are not allowed in the ultrasound rooms during exams. Our front office staff cannot provide observation of children in our lobby area while testing is being conducted. An adult accompanying a patient to their appointment will only be allowed in the ultrasound room at the discretion of the ultrasound technician under special circumstances. We apologize for any inconvenience.      Will have office call you with echo cost & out of pocket cost  Follow-Up: At [facility], you and your health needs are our priority.  As part of our continuing mission to provide you with exceptional heart care, our providers are all part of one team.  This team includes your primary Cardiologist (physician) and Advanced Practice Providers or APPs (Physician Assistants and Nurse Practitioners) who all work  together to provide you with the care you need, when you need it.  Your next appointment:   3 month(s)  Provider:   Soyla Norton, MD     Thank you for choosing [facility]!!   Maeola Domino, RN [phone]   Other Instructions

## 2023-12-22 ENCOUNTER — Other Ambulatory Visit: Payer: Self-pay

## 2023-12-22 MED ORDER — APIXABAN 5 MG PO TABS: 5.0000 mg | ORAL_TABLET | Freq: Two times a day (BID) | ORAL | 0 refills | Status: DC

## 2023-12-22 MED ORDER — DILTIAZEM HCL ER COATED BEADS 120 MG PO CP24: 120.0000 mg | ORAL_CAPSULE | Freq: Every day | ORAL | 3 refills | Status: DC

## 2023-12-22 MED ORDER — DILTIAZEM HCL ER COATED BEADS 120 MG PO CP24: 120.0000 mg | ORAL_CAPSULE | Freq: Every day | ORAL | 0 refills | Status: DC

## 2023-12-22 MED ORDER — METOPROLOL SUCCINATE ER 100 MG PO TB24: 100.0000 mg | ORAL_TABLET | Freq: Every day | ORAL | 3 refills | Status: DC

## 2023-12-22 MED ORDER — APIXABAN 5 MG PO TABS: 5.0000 mg | ORAL_TABLET | Freq: Two times a day (BID) | ORAL | 3 refills | Status: AC

## 2023-12-22 NOTE — Telephone Encounter (Signed)
 Please patient attached message  Also questions about metoprolol  increase by cardiology  Patient requesting rx rf of  diltiazem 120mg    Last written 12/04/2023 as #30  And  Eliquis  5mg    Last written 11/27/2023 as 30 day supply  Requesting both medications be refilled as 90 day supply.

## 2023-12-31 ENCOUNTER — Encounter: Payer: Self-pay | Admitting: Cardiology

## 2024-01-29 ENCOUNTER — Encounter: Payer: Self-pay | Admitting: Physician Assistant

## 2024-02-01 MED ORDER — TRAMADOL HCL 50 MG PO TABS: 50.0000 mg | ORAL_TABLET | Freq: Four times a day (QID) | ORAL | 0 refills | Status: AC | PRN

## 2024-02-01 NOTE — Telephone Encounter (Signed)
 Requesting rx rf of Tramadol  50mg   Not showing in patient current med list  Last written 12/08/2023 Last OV 10/03/225 Upcoming appt 03/21/2024

## 2024-03-21 ENCOUNTER — Encounter: Payer: Self-pay | Admitting: Cardiology

## 2024-03-21 ENCOUNTER — Ambulatory Visit: Attending: Cardiology | Admitting: Cardiology

## 2024-03-21 VITALS — BP 174/24 | HR 108 | Ht 75.0 in | Wt 263.0 lb

## 2024-03-21 DIAGNOSIS — I4819 Other persistent atrial fibrillation: Secondary | ICD-10-CM | POA: Diagnosis not present

## 2024-03-21 DIAGNOSIS — I10 Essential (primary) hypertension: Secondary | ICD-10-CM | POA: Diagnosis not present

## 2024-03-21 MED ORDER — DILTIAZEM HCL ER COATED BEADS 240 MG PO CP24: 240.0000 mg | ORAL_CAPSULE | Freq: Every day | ORAL | 3 refills | Status: AC

## 2024-03-21 MED ORDER — CARVEDILOL 25 MG PO TABS: 25.0000 mg | ORAL_TABLET | Freq: Two times a day (BID) | ORAL | 3 refills | Status: AC

## 2024-03-21 NOTE — Patient Instructions (Addendum)
 Medication Instructions:  Your physician has recommended you make the following change in your medication:  1) STOP taking metoprolol   2) START taking Coreg  (carvedilol ) 25 mg twice daily  3) INCREASE Cardizem  (diltiazem ) to 240 mg once daily    *If you need a refill on your cardiac medications before your next appointment, please call your pharmacy*  Follow-Up: At [facility], you and your health needs are our priority.  As part of our continuing mission to provide you with exceptional heart care, our providers are all part of one team.  This team includes your primary Cardiologist (physician) and Advanced Practice Providers or APPs (Physician Assistants and Nurse Practitioners) who all work together to provide you with the care you need, when you need it.  Your next appointment:   3 months  Provider:   You may see Will Gladis Norton, MD or one of the following Advanced Practice Providers on your designated Care Team:   Charlies Arthur, [place] Jodie Passey, PA-C Suzann Riddle, NP Daphne Barrack, NP Artist Pouch, PA-C

## 2024-03-21 NOTE — Progress Notes (Signed)
" °  Electrophysiology Office Note:   Date:  03/21/2024  ID:  Thomas Noble, DOB 07-15-1960, MRN [id number]  Primary Cardiologist: None Primary Heart Failure: None Electrophysiologist: Thomas Kling Gladis Norton, MD      History of Present Illness:   Thomas Noble is a 64 y.o. male with h/o atrial fibrillation, hypertension seen today for routine electrophysiology followup.   Discussed the use of AI scribe software for clinical note transcription with the patient, who gave verbal consent to proceed.  History of Present Illness Thomas Noble is a 64 year old male with atrial fibrillation who presents for medication management and blood pressure evaluation.  He feels generally okay but experiences fatigue after work. There have been no significant changes in symptoms since the last visit.  His blood pressure is typically around 148/82 mmHg, but today's reading is higher, which he attributes to a salty lunch. He acknowledges that his diet could be healthier and mentions using sea salt, kosher, and pink Himalayan salt instead of iodized salt for many years.  He is currently taking metoprolol , diltiazem , and Eliquis . He has stopped taking Celebrex . He also takes tramadol  for back pain, although he feels it may not be very effective.  He has a history of atrial fibrillation and is on Eliquis .   he denies chest pain, palpitations, dyspnea, PND, orthopnea, nausea, vomiting, dizziness, syncope, edema, weight gain, or early satiety.   Review of systems complete and found to be negative unless listed in HPI.   EP Information / Studies Reviewed:    EKG is not ordered today. EKG from 12/21/2023 reviewed which showed atrial fibrillation       Physical Exam:   VS:  There were no vitals taken for this visit.   Wt Readings from Last 3 Encounters:  12/21/23 259 lb (117.5 kg)  12/04/23 253 lb (114.8 kg)  11/27/23 256 lb (116.1 kg)     GEN: Well nourished, well developed in no acute distress NECK: No  JVD; No carotid bruits CARDIAC: Irregularly irregular rate and rhythm, no murmurs, rubs, gallops RESPIRATORY:  Clear to auscultation without rales, wheezing or rhonchi  ABDOMEN: Soft, non-tender, non-distended EXTREMITIES:  No edema; No deformity   Risk Assessment/Calculations:    CHA2DS2-VASc Score = 1   This indicates a 0.6% annual risk of stroke. The patient's score is based upon: CHF History: 0 HTN History: 1 Diabetes History: 0 Stroke History: 0 Vascular Disease History: 0 Age Score: 0 Gender Score: 0       ASSESSMENT AND PLAN:    1.  Persistent atrial fibrillation: On Eliquis , diltiazem , metoprolol . blood pressure is elevated.  He would benefit from improved rate control as well.  Thomas Noble increase his diltiazem  to 240 mg daily and switch him from metoprolol  to carvedilol  and increase the dose to 25 mg twice daily.    2.  Hypertension: Elevated today.  Switching from metoprolol .  Can be further managed by his primary physician.  Follow up with EP Team in 3 months  Signed, Thomas Lage Gladis Norton, MD  "

## 2024-06-13 ENCOUNTER — Ambulatory Visit: Admitting: Cardiology
